# Patient Record
Sex: Female | Born: 2000 | Race: White | Hispanic: No | Marital: Single | Smoking: Former smoker
Health system: Southern US, Community
[De-identification: ages and names within clinical notes are randomized; demographics above are authoritative.]

## PROBLEM LIST (undated history)

## (undated) ENCOUNTER — Inpatient Hospital Stay (HOSPITAL_COMMUNITY): Payer: Self-pay

## (undated) DIAGNOSIS — E669 Obesity, unspecified: Secondary | ICD-10-CM

## (undated) DIAGNOSIS — G4700 Insomnia, unspecified: Secondary | ICD-10-CM

## (undated) DIAGNOSIS — J020 Streptococcal pharyngitis: Secondary | ICD-10-CM

## (undated) DIAGNOSIS — F938 Other childhood emotional disorders: Secondary | ICD-10-CM

## (undated) DIAGNOSIS — N390 Urinary tract infection, site not specified: Secondary | ICD-10-CM

## (undated) DIAGNOSIS — K219 Gastro-esophageal reflux disease without esophagitis: Secondary | ICD-10-CM

## (undated) DIAGNOSIS — N159 Renal tubulo-interstitial disease, unspecified: Secondary | ICD-10-CM

## (undated) DIAGNOSIS — N200 Calculus of kidney: Secondary | ICD-10-CM

## (undated) HISTORY — PX: ADENOIDECTOMY: SUR15

## (undated) HISTORY — PX: TONSILLECTOMY: SUR1361

---
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
## 2019-01-01 ENCOUNTER — Telehealth: Payer: Self-pay | Admitting: Licensed Clinical Social Worker

## 2019-01-01 NOTE — Telephone Encounter (Signed)
Called pt for appt reminder unable to leave voicemail

## 2019-01-02 ENCOUNTER — Other Ambulatory Visit: Payer: Managed Care, Other (non HMO)

## 2019-01-02 ENCOUNTER — Encounter: Payer: Managed Care, Other (non HMO) | Admitting: Obstetrics and Gynecology

## 2019-01-09 ENCOUNTER — Other Ambulatory Visit: Payer: Managed Care, Other (non HMO)

## 2019-01-09 ENCOUNTER — Other Ambulatory Visit: Payer: Self-pay

## 2019-01-09 ENCOUNTER — Ambulatory Visit (INDEPENDENT_AMBULATORY_CARE_PROVIDER_SITE_OTHER): Payer: Managed Care, Other (non HMO) | Admitting: Nurse Practitioner

## 2019-01-09 VITALS — BP 105/66 | HR 86 | Temp 98.8°F | Wt 194.2 lb

## 2019-01-09 DIAGNOSIS — O99343 Other mental disorders complicating pregnancy, third trimester: Secondary | ICD-10-CM

## 2019-01-09 DIAGNOSIS — Z23 Encounter for immunization: Secondary | ICD-10-CM | POA: Diagnosis not present

## 2019-01-09 DIAGNOSIS — Z3400 Encounter for supervision of normal first pregnancy, unspecified trimester: Secondary | ICD-10-CM

## 2019-01-09 DIAGNOSIS — F938 Other childhood emotional disorders: Secondary | ICD-10-CM

## 2019-01-09 DIAGNOSIS — Z3A28 28 weeks gestation of pregnancy: Secondary | ICD-10-CM

## 2019-01-09 NOTE — Patient Instructions (Signed)
https://www.cdc.gov/vaccines/hcp/vis/vis-statements/tdap.pdf">  Tdap Vaccine (Tetanus, Diphtheria and Pertussis): What You Need to Know 1. Why get vaccinated? Tetanus, diphtheria and pertussis are very serious diseases. Tdap vaccine can protect us from these diseases. And, Tdap vaccine given to pregnant women can protect newborn babies against pertussis.. TETANUS (Lockjaw) is rare in the [place] today. It causes painful muscle tightening and stiffness, usually all over the body.  It can lead to tightening of muscles in the head and neck so you can't open your mouth, swallow, or sometimes even breathe. Tetanus kills about 1 out of 10 people who are infected even after receiving the best medical care. DIPHTHERIA is also rare in the [place] today. It can cause a thick coating to form in the back of the throat.  It can lead to breathing problems, heart failure, paralysis, and death. PERTUSSIS (Whooping Cough) causes severe coughing spells, which can cause difficulty breathing, vomiting and disturbed sleep.  It can also lead to weight loss, incontinence, and rib fractures. Up to 2 in 100 adolescents and 5 in 100 adults with pertussis are hospitalized or have complications, which could include pneumonia or death. These diseases are caused by bacteria. Diphtheria and pertussis are spread from person to person through secretions from coughing or sneezing. Tetanus enters the body through cuts, scratches, or wounds. Before vaccines, as many as 200,000 cases of diphtheria, 200,000 cases of pertussis, and hundreds of cases of tetanus, were reported in the [place] each year. Since vaccination began, reports of cases for tetanus and diphtheria have dropped by about 99% and for pertussis by about 80%. 2. Tdap vaccine Tdap vaccine can protect adolescents and adults from tetanus, diphtheria, and pertussis. One dose of Tdap is routinely given at age 11 or 12. People who did not get Tdap at that age  should get it as soon as possible. Tdap is especially important for healthcare professionals and anyone having close contact with a baby younger than 12 months. Pregnant women should get a dose of Tdap during every pregnancy, to protect the newborn from pertussis. Infants are most at risk for severe, life-threatening complications from pertussis. Another vaccine, called Td, protects against tetanus and diphtheria, but not pertussis. A Td booster should be given every 10 years. Tdap may be given as one of these boosters if you have never gotten Tdap before. Tdap may also be given after a severe cut or burn to prevent tetanus infection. Your doctor or the person giving you the vaccine can give you more information. Tdap may safely be given at the same time as other vaccines. 3. Some people should not get this vaccine  A person who has ever had a life-threatening allergic reaction after a previous dose of any diphtheria, tetanus or pertussis containing vaccine, OR has a severe allergy to any part of this vaccine, should not get Tdap vaccine. Tell the person giving the vaccine about any severe allergies.  Anyone who had coma or long repeated seizures within 7 days after a childhood dose of DTP or DTaP, or a previous dose of Tdap, should not get Tdap, unless a cause other than the vaccine was found. They can still get Td.  Talk to your doctor if you: ? have seizures or another nervous system problem, ? had severe pain or swelling after any vaccine containing diphtheria, tetanus or pertussis, ? ever had a condition called Guillain-Barr Syndrome (GBS), ? aren't feeling well on the day the shot is scheduled. 4. Risks With any medicine, including vaccines,   there is a chance of side effects. These are usually mild and go away on their own. Serious reactions are also possible but are rare. Most people who get Tdap vaccine do not have any problems with it. Mild problems following Tdap (Did not interfere  with activities)  Pain where the shot was given (about 3 in 4 adolescents or 2 in 3 adults)  Redness or swelling where the shot was given (about 1 person in 5)  Mild fever of at least 100.4F (up to about 1 in 25 adolescents or 1 in 100 adults)  Headache (about 3 or 4 people in 10)  Tiredness (about 1 person in 3 or 4)  Nausea, vomiting, diarrhea, stomach ache (up to 1 in 4 adolescents or 1 in 10 adults)  Chills, sore joints (about 1 person in 10)  Body aches (about 1 person in 3 or 4)  Rash, swollen glands (uncommon) Moderate problems following Tdap (Interfered with activities, but did not require medical attention)  Pain where the shot was given (up to 1 in 5 or 6)  Redness or swelling where the shot was given (up to about 1 in 16 adolescents or 1 in 12 adults)  Fever over 102F (about 1 in 100 adolescents or 1 in 250 adults)  Headache (about 1 in 7 adolescents or 1 in 10 adults)  Nausea, vomiting, diarrhea, stomach ache (up to 1 or 3 people in 100)  Swelling of the entire arm where the shot was given (up to about 1 in 500). Severe problems following Tdap (Unable to perform usual activities; required medical attention)  Swelling, severe pain, bleeding and redness in the arm where the shot was given (rare). Problems that could happen after any vaccine:  People sometimes faint after a medical procedure, including vaccination. Sitting or lying down for about 15 minutes can help prevent fainting, and injuries caused by a fall. Tell your doctor if you feel dizzy, or have vision changes or ringing in the ears.  Some people get severe pain in the shoulder and have difficulty moving the arm where a shot was given. This happens very rarely.  Any medication can cause a severe allergic reaction. Such reactions from a vaccine are very rare, estimated at fewer than 1 in a million doses, and would happen within a few minutes to a few hours after the vaccination. As with any medicine,  there is a very remote chance of a vaccine causing a serious injury or death. The safety of vaccines is always being monitored. For more information, visit: www.cdc.gov/vaccinesafety/ 5. What if there is a serious problem? What should I look for?  Look for anything that concerns you, such as signs of a severe allergic reaction, very high fever, or unusual behavior. Signs of a severe allergic reaction can include hives, swelling of the face and throat, difficulty breathing, a fast heartbeat, dizziness, and weakness. These would usually start a few minutes to a few hours after the vaccination. What should I do?  If you think it is a severe allergic reaction or other emergency that can't wait, call 9-1-1 or get the person to the nearest hospital. Otherwise, call your doctor.  Afterward, the reaction should be reported to the Vaccine Adverse Event Reporting System (VAERS). Your doctor might file this report, or you can do it yourself through the VAERS web site at www.vaers.hhs.gov, or by calling 1-800-822-7967. VAERS does not give medical advice. 6. The National Vaccine Injury Compensation Program The National Vaccine Injury Compensation Program (  VICP) is a federal program that was created to compensate people who may have been injured by certain vaccines. Persons who believe they may have been injured by a vaccine can learn about the program and about filing a claim by calling 1-800-338-2382 or visiting the VICP website at www.hrsa.gov/vaccinecompensation. There is a time limit to file a claim for compensation. 7. How can I learn more?  Ask your doctor. He or she can give you the vaccine package insert or suggest other sources of information.  Call your local or state health department.  Contact the Centers for Disease Control and Prevention (CDC): ? Call 1-800-232-4636 (1-800-CDC-INFO) or ? Visit CDC's website at www.cdc.gov/vaccines Vaccine Information Statement Tdap Vaccine (04/02/2013) This  information is not intended to replace advice given to you by your health care provider. Make sure you discuss any questions you have with your health care provider. Document Released: 07/26/2011 Document Revised: 09/11/2017 Document Reviewed: 09/11/2017 Elsevier Interactive Patient Education  2020 Elsevier Inc.  

## 2019-01-09 NOTE — Progress Notes (Signed)
    Subjective:  Ana Lloyd is a 18 y.o. G1P0000 at [redacted]w[redacted]d being seen today for ongoing prenatal care.  She is currently monitored for the following issues for this low-risk pregnancy and has MDD (major depressive disorder); Anxiety disorder of adolescence; Insomnia; Morning sickness; and Supervision of normal first pregnancy, antepartum on their problem list.  Patient reports nausea.  Contractions: Not present. Vag. Bleeding: None.  Movement: Present. Denies leaking of fluid.   The following portions of the patient's history were reviewed and updated as appropriate: allergies, current medications, past family history, past medical history, past social history, past surgical history and problem list. Problem list updated.  Objective:   Vitals:   01/09/19 1032  BP: 105/66  Pulse: 86  Temp: 98.8 F (37.1 C)  Weight: 194 lb 3.2 oz (88.1 kg)    Fetal Status: Fetal Heart Rate (bpm): 156 Fundal Height: 30 cm Movement: Present     General:  Alert, oriented and cooperative. Patient is in no acute distress.  Skin: Skin is warm and dry. No rash noted.   Cardiovascular: Normal heart rate noted  Respiratory: Normal respiratory effort, no problems with respiration noted  Abdomen: Soft, gravid, appropriate for gestational age. Pain/Pressure: Absent     Pelvic:  Cervical exam deferred        Extremities: Normal range of motion.  Edema: Trace  Mental Status: Normal mood and affect. Normal behavior. Normal judgment and thought content.   Urinalysis:      Assessment and Plan:  Pregnancy: G1P0000 at [redacted]w[redacted]d  1. Supervision of normal first pregnancy, antepartum Declines flu and understands that flu vaccine is medically recommended during pregnancy. Boyfriend had asymptomatic covid.  Client tested negative and did not have any symptoms. Taking Zofran about once a week as needed. Advised to investigate childbirth classes and breastfeeding classes and sign up for virtual classes.  - TDAP  VACCINE - Glucose Tolerance, 2 Hours w/1 Hour - HIV antibody (with reflex) - CBC - RPR  2. Anxiety disorder of adolescence Anxiety is not an issue at this time.  Discussed higher risk of postpartum depression.  Advised there is [facility] assistance through this office and to contact the office at any time in pregnancy or postpartum if assistance is needed.  Preterm labor symptoms and general obstetric precautions including but not limited to vaginal bleeding, contractions, leaking of fluid and fetal movement were reviewed in detail with the patient. Please refer to After Visit Summary for other counseling recommendations.  Return in about 2 weeks (around 01/23/2019) for virtual ROB.  Earlie Server, RN, MSN, NP-BC Nurse Practitioner, [facility] for [employer] [employer] [facility] 01/09/2019 3:14 PM

## 2019-01-10 LAB — CBC
Hematocrit: 35.5 % (ref 34.0–46.6)
Hemoglobin: 11.9 g/dL (ref 11.1–15.9)
MCH: 30.7 pg (ref 26.6–33.0)
MCHC: 33.5 g/dL (ref 31.5–35.7)
MCV: 92 fL (ref 79–97)
Platelets: 315 10*3/uL (ref 150–450)
RBC: 3.88 x10E6/uL (ref 3.77–5.28)
RDW: 11.8 % (ref 11.7–15.4)
WBC: 17.1 10*3/uL — ABNORMAL HIGH (ref 3.4–10.8)

## 2019-01-10 LAB — RPR: RPR Ser Ql: NONREACTIVE

## 2019-01-10 LAB — GLUCOSE TOLERANCE, 2 HOURS W/ 1HR
Glucose, 1 hour: 114 mg/dL (ref 65–179)
Glucose, 2 hour: 107 mg/dL (ref 65–152)
Glucose, Fasting: 89 mg/dL (ref 65–91)

## 2019-01-10 LAB — HIV ANTIBODY (ROUTINE TESTING W REFLEX): HIV Screen 4th Generation wRfx: NONREACTIVE

## 2019-01-21 ENCOUNTER — Ambulatory Visit (HOSPITAL_COMMUNITY): Payer: Managed Care, Other (non HMO) | Admitting: *Deleted

## 2019-01-21 ENCOUNTER — Other Ambulatory Visit (HOSPITAL_COMMUNITY): Payer: Self-pay | Admitting: *Deleted

## 2019-01-21 ENCOUNTER — Encounter (HOSPITAL_COMMUNITY): Payer: Self-pay

## 2019-01-21 ENCOUNTER — Ambulatory Visit (HOSPITAL_COMMUNITY)
Admission: RE | Admit: 2019-01-21 | Discharge: 2019-01-21 | Disposition: A | Discharge status: Home / Self Care | Payer: Managed Care, Other (non HMO) | Source: Ambulatory Visit | Attending: Obstetrics and Gynecology | Admitting: Obstetrics and Gynecology

## 2019-01-21 ENCOUNTER — Other Ambulatory Visit: Payer: Self-pay

## 2019-01-21 VITALS — BP 103/58 | HR 98 | Temp 97.8°F

## 2019-01-21 DIAGNOSIS — O365930 Maternal care for other known or suspected poor fetal growth, third trimester, not applicable or unspecified: Secondary | ICD-10-CM | POA: Diagnosis present

## 2019-01-21 DIAGNOSIS — Z3A28 28 weeks gestation of pregnancy: Secondary | ICD-10-CM

## 2019-01-21 DIAGNOSIS — O210 Mild hyperemesis gravidarum: Secondary | ICD-10-CM | POA: Diagnosis present

## 2019-01-21 DIAGNOSIS — Z362 Encounter for other antenatal screening follow-up: Secondary | ICD-10-CM

## 2019-01-21 DIAGNOSIS — O365920 Maternal care for other known or suspected poor fetal growth, second trimester, not applicable or unspecified: Secondary | ICD-10-CM | POA: Diagnosis not present

## 2019-01-21 DIAGNOSIS — Z3689 Encounter for other specified antenatal screening: Secondary | ICD-10-CM

## 2019-01-23 ENCOUNTER — Telehealth (INDEPENDENT_AMBULATORY_CARE_PROVIDER_SITE_OTHER): Payer: Managed Care, Other (non HMO) | Admitting: Family Medicine

## 2019-01-23 ENCOUNTER — Encounter: Payer: Self-pay | Admitting: Family Medicine

## 2019-01-23 DIAGNOSIS — Z3403 Encounter for supervision of normal first pregnancy, third trimester: Secondary | ICD-10-CM

## 2019-01-23 DIAGNOSIS — Z3A30 30 weeks gestation of pregnancy: Secondary | ICD-10-CM

## 2019-01-23 DIAGNOSIS — Z3400 Encounter for supervision of normal first pregnancy, unspecified trimester: Secondary | ICD-10-CM

## 2019-01-23 NOTE — Progress Notes (Signed)
Virtual Visit via Telephone Note  I connected with Ana Lloyd on 01/23/19 at  3:15 PM EST by telephone and verified that I am speaking with the correct person using two identifiers  [place] visit no complaints per pt

## 2019-01-23 NOTE — Progress Notes (Signed)
Patient ID: Ana Lloyd, female   DOB: Aug 20, 2000, 18 y.o.   MRN: [id number]  I connected with@ on 01/23/19 at  3:15 PM EST by: MyChart and verified that I am speaking with the correct person using two identifiers.  Patient is located at home and provider is located at [facility].     The purpose of this virtual visit is to provide medical care while limiting exposure to the novel coronavirus. I discussed the limitations, risks, security and privacy concerns of performing an evaluation and management service by MyChart and the availability of in person appointments. I also discussed with the patient that there may be a patient responsible charge related to this service. By engaging in this virtual visit, you consent to the provision of healthcare.  Additionally, you authorize for your insurance to be billed for the services provided during this visit.  The patient expressed understanding and agreed to proceed.  The following staff members participated in the virtual visit:  Clarnce Flock, MD    PRENATAL VISIT NOTE  Subjective:  Ana Lloyd is a 18 y.o. G1P0000 at [redacted]w[redacted]d  for phone visit for ongoing prenatal care.  She is currently monitored for the following issues for this low-risk pregnancy and has MDD (major depressive disorder); Anxiety disorder of adolescence; Insomnia; Morning sickness; and Supervision of normal first pregnancy, antepartum on their problem list.  Patient reports no bleeding, no contractions, no cramping and no leaking.  Contractions: Not present. Vag. Bleeding: None.  Movement: Present. Denies leaking of fluid.   The following portions of the patient's history were reviewed and updated as appropriate: allergies, current medications, past family history, past medical history, past social history, past surgical history and problem list.   Objective:   Vitals:   01/23/19 1442  BP: 121/69  Pulse: (!) 108   Self-Obtained  Fetal Status:     Movement:  Present     Assessment and Plan:  Pregnancy: G1P0000 at [redacted]w[redacted]d 1. Supervision of normal first pregnancy, antepartum Stable BP normal Serial growths with MFM for ?IUGR, growth now normal and likely secondary to inaccurate dates, has follow up scheduled for next month, she is aware Prenatal testing up to date Discussed PP contraception, she is interested in post-placental IUD, unclear if covered since she has [place] w medicaid secondary, will clarify and send patient update  Preterm labor symptoms and general obstetric precautions including but not limited to vaginal bleeding, contractions, leaking of fluid and fetal movement were reviewed in detail with the patient.  Return in 2 weeks (on 02/06/2019) for [facility], virtual, ob visit.  Future Appointments  Date Time Provider [place]  02/19/2019  8:30 AM WH-MFC [place] 5 WH-MFCUS MFC-US  02/19/2019  8:35 AM WH-MFC NURSE [place] MFC-US     Time spent on virtual visit: 10 minutes  Clarnce Flock, MD

## 2019-01-23 NOTE — Patient Instructions (Signed)

## 2019-02-06 ENCOUNTER — Telehealth (INDEPENDENT_AMBULATORY_CARE_PROVIDER_SITE_OTHER): Payer: Managed Care, Other (non HMO) | Admitting: Medical

## 2019-02-06 ENCOUNTER — Encounter: Payer: Self-pay | Admitting: Medical

## 2019-02-06 DIAGNOSIS — O210 Mild hyperemesis gravidarum: Secondary | ICD-10-CM

## 2019-02-06 DIAGNOSIS — Z3A32 32 weeks gestation of pregnancy: Secondary | ICD-10-CM

## 2019-02-06 DIAGNOSIS — Z3400 Encounter for supervision of normal first pregnancy, unspecified trimester: Secondary | ICD-10-CM

## 2019-02-06 NOTE — Patient Instructions (Addendum)
Fetal Movement Counts Patient Name: ________________________________________________ Patient Due Date: ____________________ What is a fetal movement count?  A fetal movement count is the number of times that you feel your baby move during a certain amount of time. This may also be called a fetal kick count. A fetal movement count is recommended for every pregnant woman. You may be asked to start counting fetal movements as early as week 28 of your pregnancy. Pay attention to when your baby is most active. You may notice your baby's sleep and wake cycles. You may also notice things that make your baby move more. You should do a fetal movement count:  When your baby is normally most active.  At the same time each day. A good time to count movements is while you are resting, after having something to eat and drink. How do I count fetal movements? 1. Find a quiet, comfortable area. Sit, or lie down on your side. 2. Write down the date, the start time and stop time, and the number of movements that you felt between those two times. Take this information with you to your health care visits. 3. For 2 hours, count kicks, flutters, swishes, rolls, and jabs. You should feel at least 10 movements during 2 hours. 4. You may stop counting after you have felt 10 movements. 5. If you do not feel 10 movements in 2 hours, have something to eat and drink. Then, keep resting and counting for 1 hour. If you feel at least 4 movements during that hour, you may stop counting. Contact a health care provider if:  You feel fewer than 4 movements in 2 hours.  Your baby is not moving like he or she usually does. Date: ____________ Start time: ____________ Stop time: ____________ Movements: ____________ Date: ____________ Start time: ____________ Stop time: ____________ Movements: ____________ Date: ____________ Start time: ____________ Stop time: ____________ Movements: ____________ Date: ____________ Start time:  ____________ Stop time: ____________ Movements: ____________ Date: ____________ Start time: ____________ Stop time: ____________ Movements: ____________ Date: ____________ Start time: ____________ Stop time: ____________ Movements: ____________ Date: ____________ Start time: ____________ Stop time: ____________ Movements: ____________ Date: ____________ Start time: ____________ Stop time: ____________ Movements: ____________ Date: ____________ Start time: ____________ Stop time: ____________ Movements: ____________ This information is not intended to replace advice given to you by your health care provider. Make sure you discuss any questions you have with your health care provider. Document Released: 02/23/2006 Document Revised: 02/13/2018 Document Reviewed: 03/05/2015 Elsevier Patient Education  2020 Elsevier Inc. [employer] of the uterus can occur throughout pregnancy, but they are not always a sign that you are in labor. You may have practice contractions called Braxton Hicks contractions. These false labor contractions are sometimes confused with true labor. What are Montine Circle contractions? Braxton Hicks contractions are tightening movements that occur in the muscles of the uterus before labor. Unlike true labor contractions, these contractions do not result in opening (dilation) and thinning of the cervix. Toward the end of pregnancy (32-34 weeks), Braxton Hicks contractions can happen more often and may become stronger. These contractions are sometimes difficult to tell apart from true labor because they can be very uncomfortable. You should not feel embarrassed if you go to the hospital with false labor. Sometimes, the only way to tell if you are in true labor is for your health care provider to look for changes in the cervix. The health care provider will do a physical exam and may monitor your contractions. If you  are not in true labor, the exam should show  that your cervix is not dilating and your water has not broken. If there are no other health problems associated with your pregnancy, it is completely safe for you to be sent home with false labor. You may continue to have Braxton Hicks contractions until you go into true labor. How to tell the difference between true labor and false labor True labor  Contractions last 30-70 seconds.  Contractions become very regular.  Discomfort is usually felt in the top of the uterus, and it spreads to the lower abdomen and low back.  Contractions do not go away with walking.  Contractions usually become more intense and increase in frequency.  The cervix dilates and gets thinner. False labor  Contractions are usually shorter and not as strong as true labor contractions.  Contractions are usually irregular.  Contractions are often felt in the front of the lower abdomen and in the groin.  Contractions may go away when you walk around or change positions while lying down.  Contractions get weaker and are shorter-lasting as time goes on.  The cervix usually does not dilate or become thin. Follow these instructions at home:   Take over-the-counter and prescription medicines only as told by your health care provider.  Keep up with your usual exercises and follow other instructions from your health care provider.  Eat and drink lightly if you think you are going into labor.  If Braxton Hicks contractions are making you uncomfortable: ? Change your position from lying down or resting to walking, or change from walking to resting. ? Sit and rest in a tub of warm water. ? Drink enough fluid to keep your urine pale yellow. Dehydration may cause these contractions. ? Do slow and deep breathing several times an hour.  Keep all follow-up prenatal visits as told by your health care provider. This is important. Contact a health care provider if:  You have a fever.  You have continuous pain in  your abdomen. Get help right away if:  Your contractions become stronger, more regular, and closer together.  You have fluid leaking or gushing from your vagina.  You pass blood-tinged mucus (bloody show).  You have bleeding from your vagina.  You have low back pain that you never had before.  You feel your baby's head pushing down and causing pelvic pressure.  Your baby is not moving inside you as much as it used to. Summary  Contractions that occur before labor are called Braxton Hicks contractions, false labor, or practice contractions.  Braxton Hicks contractions are usually shorter, weaker, farther apart, and less regular than true labor contractions. True labor contractions usually become progressively stronger and regular, and they become more frequent.  Manage discomfort from [facility] contractions by changing position, resting in a warm bath, drinking plenty of water, or practicing deep breathing. This information is not intended to replace advice given to you by your health care provider. Make sure you discuss any questions you have with your health care provider. Document Released: 06/09/2016 Document Revised: 01/06/2017 Document Reviewed: 06/09/2016 Elsevier Patient Education  2020 [place] [phone]) . [facility] Davy Pique, MD; Gwendlyn Deutscher, MD; Walker Kehr, MD; Andria Frames, MD; McDiarmid, MD; Dutch Quint, MD; Nori Riis, MD; [place], [address] [place], [place] [address] o [phone] o Mon-Fri 8:30-12:30, 1:30-5:00 o Providers come to see babies at [facility] o Accepting Medicaid . [facility] providers who accept newborns: Dorthy Cooler, MD; Orland Mustard, MD; Stephanie Acre, MD o [place], [place], [place] [address] o [phone] o Mon-Fri 8:00-5:30 o Babies seen by providers at [facility] o Does NOT accept  Medicaid o Please call early in hospitalization for appointment (limited availability)  . [place], MD o [address] [place], [place] [address] o [phone] o Mon, Tue, Thur, Fri 8:30-5:00, Wed 10:00-7:00 (closed 1-2pm) o Babies seen by [facility] providers o Accepting Medicaid . [place], MD o [place], [place], [place] [address] o [phone] o Mon-Fri 8:30-5:00, Sat 8:30-12:00 o Provider comes to see babies at [place] Medicaid o Must have been referred from current patients or contacted office prior to delivery . [facility] ([facility] [place]) Franne Forts, MD; Tamera Punt, MD; Doneen Poisson, MD; Fatima Sanger, MD; Wynetta Emery, MD; Jess Barters, MD; Tami Ribas, MD; Herbert Moors, MD; Derrell Lolling, MD; Dorothyann Peng, MD; Lucious Groves, NP; Baldo Ash, NP o [address] [place] [address], [place], [place] [address] o [phone] o Mon, Tue, Thur, Fri 8:30-5:30, Wed 9:30-5:30, Sat 8:30-12:30 o Babies seen by [facility] providers o Accepting Medicaid o Only accepting infants of first-time parents or siblings of current patients [facility] discharge coordinator will make follow-up appointment . Baltazar Najjar o [place] [address], [place], [place]  [address] o [phone]   Fax - [phone] . [facility] o [address], [place], [place]  [address] o Phone - [phone]   Fax [phone] . [place], [place], [place], [place]  [address] o [phone]  [place] [phone]) . [facility] Reginal Lutes, MD; Jacklynn Ganong, MD; Torrie Mayers, MD; MD; Rosana Hoes, MD; Servando Salina, MD; Rose Fillers, MD; Rex Kras, MD; Corinna Capra, MD; Volney American, MD; Trilby Drummer, MD; Janann Colonel, MD; Jimmye Norman, [place], [place], [place] [address] o [phone] o Mon-Fri 8:30-5:00 (extended evenings Mon-Thur as needed), Sat-Sun 10:00-1:00 o Providers come to see babies at [place] Medicaid for families of first-time babies and families with all children in the household age 24 and under. Must register with office prior to making appointment (M-F only). . [place], NP; Tomi Bamberger, MD; [place] School, MD; [place], [address] [place], [place] [address] o [phone] o Mon-Fri 8:00-5:00 o Babies seen by providers at [facility] o Does NOT accept Medicaid/Commercial Insurance Only . [facility] at [place] ([facility])  Marnee Guarneri, MD; Drema Dallas, MD; Montine Circle, MD; Vilma Prader, MD; Vanita Panda, MD; Alfonso Ramus, MD; Ruthann Cancer, MD; Roxanne Mins, MD; Rosalva Ferron, MD; Polly Cobia, MD o [address] [place], [place] [address] o [phone] o Mon-Fri 8:30-5:30, Sat (Oct.-Mar.) 9:00-1:00 o Babies seen by providers at [place] [phone]) . [facility] Peers, MD; Suzan Slick, MD o [address], [place], [place] [address] o [phone] o Mon-Fri 8:30-5:00, Sat 8:30-12:00 o Providers come to see babies at [facility] o Does NOT accept Medicaid . [place] at [place], [place]; [place], MD; [place], [place]; Nancy Fetter, MD; Moreen Fowler, [place], [place],  [address] o [phone] o Mon-Fri 8:00-5:00 o Babies seen by providers at [facility] o Does NOT accept Medicaid o Only accepting babies of parents who are patients o Please call early in hospitalization for appointment (limited availability) . [facility] Blanca Friend, MD; Sharlene Motts, MD; Rod Can, MD; Warner Mccreedy, NP; Sabra Heck, MD; Ermalinda Memos, MD; Sharlett Iles, NP; Aurther Loft, MD; Jerrye Beavers, MD; Marcello Moores, MD; Berline Lopes, MD; Charolette Forward, MD o [place]. [place], [place], [place] [address] o [phone] o Mon-Fri 8:00-5:00, Sat 9:00-12:00 o Providers come to see babies at [facility] o Does NOT accept [facility] [phone]) . [place] [facility] [place] providers accepting new patients: Dayna Ramus, NP; [place], [place], [place], [place] [address] o [phone] o Mon-Fri 8:00-5:00 o Babies seen by providers at [facility] o Does NOT accept Medicaid o Only accepting babies of parents who are patients o Please call early in hospitalization for appointment (limited availability) . [facility] Oswaldo Conroy, MD; Sheran Lawless, MD o [place] [place], [place] [address] o [phone] (press 1 to schedule appointment) o Mon-Fri 8:00-5:00 o Providers come to [facility] at [facility] o Does NOT accept Medicaid . [facility] Jodi Mourning, MD o [address] [place], [place] [address] o [phone] o Mon-Fri 8:30-5:00 (lunch 12:30-1:00), extended hours by appointment only Wed 5:00-6:30 o Babies seen by [facility] providers o Accepting Medicaid . [place] at Evalyn Casco, MD; [place], MD; Ethlyn Gallery, MD o [place], [place], [place] [address] o [phone] o Mon-Fri 8:00-5:00 o Babies seen by [facility] providers o Does NOT accept Medicaid . Therapist, music at [place], MD; Yong Channel, MD; [place], [place] [address] [place], [place] [address] o [phone] o Mon-Fri 8:00-5:00 o Babies seen by [facility] providers o Does NOT accept Medicaid . [place], [place]; [place], [place]; [place], NP; Albertina Parr, MD; Frederic Jericho, MD; Ronney Lion, MD; Carlos Levering, NP; Jerelene Redden, NP; Tomasita Crumble, NP; Ronelle Nigh, NP; Corinna Lines, MD; [place], MD o [address] [place]., [place], [place] [address] o [phone] o Mon-Fri 8:30-5:00, Sat 10:00-1:00 o Providers come to see babies at [facility] o Does NOT accept Medicaid o Free prenatal information session Tuesdays at 4:45pm . [facility] Porfirio Oar, MD; [place], [place]; [place], [place]; [place], [place]., [place] [address] o [phone] o Mon-Fri  7:30-5:30 o Babies seen by [facility] providers . [facility] o [address] [place], [place], [place]  [address] o [phone]   Fax - [phone]  [place] [phone] & [phone]) . [place] o [address] [place], [place] [address] o [phone] o Mon-Thur 8:00-6:00 o Providers come to see babies at [place] Medicaid . [place], NP; Melford Aase, MD; [place], [place]; [place], [address]., [place], [place] [address] o [phone] o Mon-Thur 7:30-7:30, Fri 7:30-4:30 o Babies seen by [facility] providers o Accepting Medicaid . [facility] Nyra Jabs, MD; Cristino Martes, NP; Gertie Baron, MD o [address], [place], [place] [address] o [phone] o Mon-Fri 8:30-5:00, Sat 8:30-12:00 o Providers come to see babies at [place] Medicaid o Must have "Meet & Greet" appointment at office prior to delivery . [place] ([place]) Jodene Nam, MD; Juleen China, MD; Clydene Laming, [place] [address], [place], [place] [address] o [phone] o Mon-Wed 8:00-6:00, Thur-Fri 8:00-5:00, Sat 9:00-12:00 o Providers come to see babies at [facility] o Does NOT accept Medicaid o Only accepting siblings of current patients . [facility] [place], [place], [place],  [place]  [address] o [phone]   Fax - [phone] . [place] at [place] [address] [place], [place]  [address] o [phone]   Fax - [place] [phone] & [phone]) . Therapist, music at [place], DO; [place], [place]., [place], [place] [address] o [phone] o Mon-Fri 7:00-5:00 o Babies seen by [facility] providers o Does NOT accept Medicaid . [place], MD; [place], [place]; [place], [place], [place], [place] [address] o [phone] o Mon-Fri 8:00-5:00 o Babies seen by [facility] providers o Accepting Medicaid . [place], MD; [place], [place]; [place], NP; [place], [place], [place], [place] [address] o [phone] o Mon-Fri 8:00-5:00 o Babies seen by providers at [place] [phone]) . [facility] [place], [place] o [address]., [place], [place] [address] o [phone] o Mon-Fri 8:00-5:00 o Babies seen by [facility] providers o Does NOT accept Medicaid o Limited availability, please call early in hospitalization to schedule follow-up . [facility] Leilani Merl, PA; Maisie Fus, MD; Gas, MD; [place], [place]; Jeannine Kitten, MD; [place], [place] [facility] [address], [place], [place] [address] o [phone] o Mon-Fri 8:30-5:00, Sat 9:00-12:00 o Babies seen by providers at [facility] o Accepting Medicaid o Please register online then schedule online or call office o [email or web address] . [place] ([facility] Perez at [place]) Kristian Covey, NP; Dwyane Dee, MD; Leonidas Romberg, PA o [address] [place], [place], [place] [address] o [phone] o Mon-Fri 8:00-5:00 o Babies seen by providers at [facility] o Accepting Medicaid . [place] ([facility] [employer]) Dairl Ponder, MD; Rayvon Char, NP; Melina Modena, MD o [address] [place], [place], [place] [address] o [phone] o Mon-Fri 8:00-5:30, Sat&Sun by appointment (phones open at 8:30) o Babies seen by [facility] providers o Accepting Medicaid o Must be a first-time baby or sibling of current patient . [place], [address], [place], [place]   [address] o [phone]   Fax - [phone]  [place] [phone] & [phone]) . [place], [place]; [place], [place]; Benjamine Mola, MD; [place], [place]; Harrell Lark, MD o [address], [place], [place] [address] o [phone] o Mon-Thur 8:00-7:00, Fri 8:00-5:00, Sat 8:00-12:00, Sun 9:00-12:00 o Babies seen by [facility] providers o Accepting Medicaid . [facility] at [facility], MD; Ruthann Cancer, MD; [facility], MD o [address], [place],  [address] o [phone] o Mon-Thur 8:00-5:00 o Babies seen by providers at [facility] o Accepting Medicaid . [facility] at [place], MD; Coe[place]Go[place], MD; Amedeo Plenty, MD; Bobby Rumpf, MD; List, MD; [place] Drafts, MD; Ruthann Cancer, MD; Selinda Eon, MD; Audie Box, MD; Jim Like, MD; Christie Nottingham, MD; Hubbard Hartshorn, MD; Modena Nunnery, MD o [address] [place], [place] [address] o [phone] o Mon-Fri 8:00-5:30, Sat (Oct.-Mar.) 9:00-1:00 o Babies seen by providers at [facility] o Accepting Medicaid o Must fill out new patient packet, available online at [email or web address] . [place] ([facility]) Barnabas Lister, NP; Kenton Kingfisher, NP; Claiborne Billings, NP; Rolla Plate, MD; [place], [place]; Carola Rhine, MD; Tyron Russell, MD; Delia Chimes,  NP o [address], [place], [place] [address] o [phone] o Mon-Thur 8:00-5:30, Fri 8:00-5:00 o Babies seen by providers at [place] [phone]) . [place], [place]; [place], MD; Dennard Schaumann, MD; [place], [place] o [address] [place], [place] [address] o [phone] o Mon-Fri 8:00-5:00 o Babies seen by providers at [place] [phone]) . [place] at [place], [place]; Olen Pel, MD; [place], [place], [place], [place] [address] o [phone] o Mon-Fri 8:00-5:00 o Babies  seen by providers at [facility] o Does NOT accept Medicaid o Limited appointment availability, please call early in hospitalization  . Therapist, music at [place], [place]; [place], [place] [address] [place], [place] [address] o [phone] o Mon-Fri 8:00-5:00 o Babies seen by [facility] providers o Does NOT accept Medicaid . [facility] Su Grand, MD; Guy Sandifer, MD; [place], [place]; [place], [place] [address], [place], [place] [address] o [phone] o Mon-Fri 8:00-5:00 o After hours clinic [facility] [place], [place] [address]) [phone] Mon-Fri 5:00-8:00, Sat 12:00-6:00, Sun 10:00-4:00 o Babies seen by [facility] providers o Accepting Medicaid . [place] at [facility] o [address], [place], [place]  [address] o [phone]   Fax - [phone]  [place] [phone]) . Therapist, music at [facility] [place], MD o [address] [place], [place], [place] [address] o [phone] o Mon-Fri 8:00-5:00 o Babies seen by [facility] providers o Does NOT accept Medicaid . [place] ([place] [facility] [place]) Bing Neighbors, MD o [address] [place], [place],  [address] o [phone] o Mon-Thur 8:00-7:00, Fri 8:00-5:00, Sat 8:00-12:00 o Babies seen by providers at [facility] o Accepting Medicaid - but does not have vaccinations in office (must be received elsewhere) o Limited availability, please call early in hospitalization  [place] ([address]) . [place], MD o [address], [place] [address] o [phone]  Fax [phone]  Childbirth Education Options: [facility] Department Classes:  Childbirth education classes can help you get ready for a positive parenting experience. You can also meet other expectant parents and get free stuff for your baby. Each class runs for  five weeks on the same night and costs $45 for the mother-to-be and her support person. Medicaid covers the cost if you are eligible. Call [phone] to register. [facility] Childbirth Education:  [phone] or [phone] or [email or web address]  Baby & Me Class: Discuss newborn & infant parenting and family adjustment issues with other new mothers in a relaxed environment. Each week brings a new speaker or baby-centered activity. We encourage new mothers to join [place] every Thursday at 11:00am. Babies birth until crawling. No registration or fee. Daddy [employer]: This course offers Dads-to-be the tools and knowledge needed to feel confident on their journey to becoming new fathers. Experienced dads, who have been trained as coaches, teach dads-to-be how to hold, comfort, diaper, swaddle and play with their infant while being able to support the new mom as well. A class for men taught by men. $25/dad Big Brother/Big Sister: Let your children share in the joy of a new brother or sister in this special class designed just for them. Class includes discussion about how families care for  babies: swaddling, holding, diapering, safety as well as how they can be helpful in their new role. This class is designed for children ages 26 to 67, but any age is welcome. Please register each child individually. $5/child  Mom Talk: This mom-led group offers support and connection to mothers as they journey through the adjustments and struggles of that sometimes overwhelming first year after the birth of a child. Tuesdays at 10:00am and Thursdays at 6:00pm. Babies welcome. No registration or fee. Breastfeeding Support Group: This group is a mother-to-mother support circle where moms have the opportunity to share their breastfeeding experiences. A Lactation Consultant is present for questions and concerns. Meets each Tuesday at 11:00am. No fee or registration. Breastfeeding Your Baby: Learn what to expect in the  first days of breastfeeding your newborn.  This class will help you feel more confident with the skills needed to begin your breastfeeding experience. Many new mothers are concerned about breastfeeding after leaving the hospital. This class will also address the most common fears and challenges about breastfeeding during the first few weeks, months and beyond. (call for fee) Comfort Techniques and Tour: This 2 hour interactive class will provide you the opportunity to learn & practice hands-on techniques that can help relieve some of the discomfort of labor and encourage your baby to rotate toward the best position for birth. You and your partner will be able to try a variety of labor positions with birth balls and rebozos as well as practice breathing, relaxation, and visualization techniques. A tour of the [facility] is included with this class. $20 per registrant and support person Childbirth Class- Weekend Option: This class is a Weekend version of our Birth & Baby series. It is designed for parents who have a difficult time fitting several weeks of classes into their schedule. It covers the care of your newborn and the basics of labor and childbirth. It also includes a [facility] and lunch. The class is held two consecutive days: beginning on Friday evening from 6:30 - 8:30 p.m. and the next day, Saturday from 9 a.m. - 4 p.m. (call for fee) Doren Custard Class: Interested in a waterbirth?  This informational class will help you discover whether waterbirth is the right fit for you. Education about waterbirth itself, supplies you would need and how to assemble your support team is what you can expect from this class. Some obstetrical practices require this class in order to pursue a waterbirth. (Not all obstetrical practices offer waterbirth-check with your healthcare provider.) Register only the expectant mom, but you are encouraged to bring your  partner to class! Required if planning waterbirth, no fee. Infant/Child CPR: Parents, grandparents, babysitters, and friends learn Cardio-Pulmonary Resuscitation skills for infants and children. You will also learn how to treat both conscious and unconscious choking in infants and children. This Family & Friends program does not offer certification. Register each participant individually to ensure that enough mannequins are available. (Call for fee) Grandparent Love: Expecting a grandbaby? This class is for you! Learn about the latest infant care and safety recommendations and ways to support your own child as he or she transitions into the parenting role. Taught by Registered Nurses who are childbirth instructors, but most importantly...they are grandmothers too! $10/person. Childbirth Class- Natural Childbirth: This series of 5 weekly classes is for expectant parents who want to learn and practice natural methods of coping with the process of labor and childbirth. Relaxation, breathing, massage, visualization, role  of the partner, and helpful positioning are highlighted. Participants learn how to be confident in their body's ability to give birth. This class will empower and help parents make informed decisions about their own care. Includes discussion that will help new parents transition into the immediate postpartum period. [facility] is included. We suggest taking this class between 25-32 weeks, but it's only a recommendation. $75 per registrant and one support person or $30 Medicaid. Childbirth Class- 3 week Series: This option of 3 weekly classes helps you and your labor partner prepare for childbirth. Newborn care, labor & birth, cesarean birth, pain management, and comfort techniques are discussed and a [place] [facility] is included. The class meets at the same time, on the same day of the week for 3 consecutive weeks beginning with the  starting date you choose. $60 for registrant and one support person.  Marvelous Multiples: Expecting twins, triplets, or more? This class covers the differences in labor, birth, parenting, and breastfeeding issues that face multiples' parents. NICU tour is included. Led by a Certified Childbirth Educator who is the mother of twins. No fee. Caring for Baby: This class is for expectant and adoptive parents who want to learn and practice the most up-to-date newborn care for their babies. Focus is on birth through the first six weeks of life. Topics include feeding, bathing, diapering, crying, umbilical cord care, circumcision care and safe sleep. Parents learn to recognize symptoms of illness and when to call the pediatrician. Register only the mom-to-be and your partner or support person can plan to come with you! $10 per registrant and support person Childbirth Class- online option: This online class offers you the freedom to complete a Birth and Baby series in the comfort of your own home. The flexibility of this option allows you to review sections at your own pace, at times convenient to you and your support people. It includes additional video information, animations, quizzes, and extended activities. Get organized with helpful eClass tools, checklists, and trackers. Once you register online for the class, you will receive an email within a few days to accept the invitation and begin the class when the time is right for you. The content will be available to you for 60 days. $60 for 60 days of online access for you and your support people.  Local Doulas: Natural Baby Doulas [email or web address] Tel: [phone] [email or web address] [employer] [phone] [email or web address] The Labor Hassell Halim  (also do waterbirth tub rental) [phone] [email or web address] [facility] Birth  Doula [phone] [email or web address] NotebookDistributors.fi [facility] Rhythms  [phone] [email or web address] [employer] (PADA) [email or web address] [place] Birth and Baby  [email or web address] Considering Waterbirth? Guide for patients at [facility] [employer]  Why consider waterbirth?  . Gentle birth for babies . Less pain medicine used in labor . May allow for passive descent/less pushing . May reduce perineal tears  . More mobility and instinctive maternal position changes . Increased maternal relaxation . Reduced blood pressure in labor  Is waterbirth safe? What are the risks of infection, drowning or other complications?  . Infection: o Very low risk (3.7 % for tub vs 4.8% for bed) o 7 in 8000 waterbirths with documented infection o Poorly cleaned equipment most common cause o Slightly lower group B strep transmission rate  . Drowning o Maternal:  - Very low risk   - Related to seizures or fainting o Newborn:  - Very low risk. No evidence of increased risk of  respiratory problems in multiple large studies - Physiological protection from breathing under water - Avoid underwater birth if there are any fetal complications - Once baby's head is out of the water, keep it out.  . Birth complication o Some reports of cord trauma, but risk decreased by bringing baby to surface gradually o No evidence of increased risk of shoulder dystocia. Mothers can usually change positions faster in water than in a bed, possibly aiding the maneuvers to free the shoulder.   You must attend a Doren Custard class at [facility]  3rd Wednesday of every month from 7-9pm  [employer] by calling [phone] or online at [email or web address]  Bring [place] the certificate from the class to your prenatal appointment  Meet with a midwife at 36 weeks to see if you can still plan a  waterbirth and to sign the consent.   Purchase or rent the following supplies:   Water Birth Pool (Birth Pool in a Box or [place] for instance)  (Tubs start ~$125)  Single-use disposable tub liner designed for your brand of tub  New garden hose labeled "lead-free", "suitable for drinking water",  Electric drain pump to remove water (We recommend 792 gallon per hour or greater pump.)   Separate garden hose to remove the dirty water  Fish net  Bathing suit top (optional)  Long-handled mirror (optional)  Places to purchase or rent supplies  GotWebTools.is for tub purchases and supplies  Waterbirthsolutions.com for tub purchases and supplies  The Labor Ladies (www.[email or web address]) $275 for tub rental/set-up & take down/kit   [employer] (http://www.fleming.com/.htm) Information regarding doulas (labor support) who provide pool rentals  Our practice has a Birth Pool in a Box tub at the hospital that you may borrow on a first-come-first-served basis. It is your responsibility to to set up, clean and break down the tub. We cannot guarantee the availability of this tub in advance. You are responsible for bringing all accessories listed above. If you do not have all necessary supplies you cannot have a waterbirth.    Things that would prevent you from having a waterbirth:  Premature, <37wks  Previous cesarean birth  Presence of thick meconium-stained fluid  Multiple gestation (Twins, triplets, etc.)  Uncontrolled diabetes or gestational diabetes requiring medication  Hypertension requiring medication or diagnosis of pre-eclampsia  Heavy vaginal bleeding  Non-reassuring fetal heart rate  Active infection (MRSA, etc.). Group B Strep is NOT a contraindication for  waterbirth.  If your labor has to be induced and induction method requires continuous  monitoring of the baby's heart rate  Other risks/issues identified by your obstetrical  provider  Please remember that birth is unpredictable. Under certain unforeseeable circumstances your provider may advise against giving birth in the tub. These decisions will be made on a case-by-case basis and with the safety of you and your baby as our highest priority.

## 2019-02-06 NOTE — Progress Notes (Signed)
I connected with  Ana Lloyd on 02/06/19 by a video enabled telemedicine application and verified that I am speaking with the correct person using two identifiers.   I discussed the limitations of evaluation and management by telemedicine. The patient expressed understanding and agreed to proceed.   MyChart ROB.  Reports no problems today.

## 2019-02-06 NOTE — Progress Notes (Signed)
I connected with [place] on 02/06/19 at 10:55 AM EST by: MyChart and verified that I am speaking with the correct person using two identifiers.  Patient is located at home and provider is located at [facility].     The purpose of this virtual visit is to provide medical care while limiting exposure to the novel coronavirus. I discussed the limitations, risks, security and privacy concerns of performing an evaluation and management service by MyChart and the availability of in person appointments. I also discussed with the patient that there may be a patient responsible charge related to this service. By engaging in this virtual visit, you consent to the provision of healthcare.  Additionally, you authorize for your insurance to be billed for the services provided during this visit.  The patient expressed understanding and agreed to proceed.  The following staff members participated in the virtual visit:  Jasmine Awe, RMA    PRENATAL VISIT NOTE  Subjective:  Ana Lloyd is a 18 y.o. G1P0000 at [redacted]w[redacted]d  for phone visit for ongoing prenatal care.  She is currently monitored for the following issues for this low-risk pregnancy and has MDD (major depressive disorder); Anxiety disorder of adolescence; Insomnia; Morning sickness; and Supervision of normal first pregnancy, antepartum on their problem list.  Patient reports no complaints.  Contractions: Not present. Vag. Bleeding: None.  Movement: Present. Denies leaking of fluid.   The following portions of the patient's history were reviewed and updated as appropriate: allergies, current medications, past family history, past medical history, past social history, past surgical history and problem list.   Objective:   Vitals:   02/06/19 1057  BP: 116/74  Pulse: (!) 117   Self-Obtained  Fetal Status:     Movement: Present     Assessment and Plan:  Pregnancy: G1P0000 at [redacted]w[redacted]d 1. Morning sickness - Improved   2. Supervision  of normal first pregnancy, antepartum - Doing well, no complaints  - Peds list given   Preterm labor symptoms and general obstetric precautions including but not limited to vaginal bleeding, contractions, leaking of fluid and fetal movement were reviewed in detail with the patient.  Return in about 2 weeks (around 02/20/2019) for LOB, Virtual.  Future Appointments  Date Time Provider [place]  02/19/2019  8:30 AM [facility]-MFC [place] 5 WH-MFCUS MFC-US  02/19/2019  8:35 AM [place] MFC-US     Time spent on virtual visit: 10 minutes  Kerry Hough, PA-C

## 2019-02-19 ENCOUNTER — Ambulatory Visit (HOSPITAL_COMMUNITY): Payer: Managed Care, Other (non HMO) | Admitting: *Deleted

## 2019-02-19 ENCOUNTER — Ambulatory Visit (HOSPITAL_COMMUNITY)
Admission: RE | Admit: 2019-02-19 | Discharge: 2019-02-19 | Disposition: A | Discharge status: Home / Self Care | Payer: Managed Care, Other (non HMO) | Source: Ambulatory Visit | Attending: Obstetrics and Gynecology | Admitting: Obstetrics and Gynecology

## 2019-02-19 ENCOUNTER — Other Ambulatory Visit: Payer: Self-pay

## 2019-02-19 ENCOUNTER — Encounter (HOSPITAL_COMMUNITY): Payer: Self-pay

## 2019-02-19 VITALS — BP 117/71 | HR 97 | Temp 97.5°F

## 2019-02-19 DIAGNOSIS — Z3A33 33 weeks gestation of pregnancy: Secondary | ICD-10-CM | POA: Diagnosis not present

## 2019-02-19 DIAGNOSIS — O0990 Supervision of high risk pregnancy, unspecified, unspecified trimester: Secondary | ICD-10-CM | POA: Diagnosis present

## 2019-02-19 DIAGNOSIS — Z362 Encounter for other antenatal screening follow-up: Secondary | ICD-10-CM

## 2019-02-19 DIAGNOSIS — O365930 Maternal care for other known or suspected poor fetal growth, third trimester, not applicable or unspecified: Secondary | ICD-10-CM | POA: Diagnosis not present

## 2019-02-19 DIAGNOSIS — Z3689 Encounter for other specified antenatal screening: Secondary | ICD-10-CM | POA: Diagnosis present

## 2019-02-20 ENCOUNTER — Encounter: Payer: Self-pay | Admitting: Obstetrics

## 2019-02-20 ENCOUNTER — Telehealth (INDEPENDENT_AMBULATORY_CARE_PROVIDER_SITE_OTHER): Payer: Managed Care, Other (non HMO) | Admitting: Obstetrics

## 2019-02-20 DIAGNOSIS — Z3400 Encounter for supervision of normal first pregnancy, unspecified trimester: Secondary | ICD-10-CM

## 2019-02-20 DIAGNOSIS — F938 Other childhood emotional disorders: Secondary | ICD-10-CM

## 2019-02-20 DIAGNOSIS — Z3A34 34 weeks gestation of pregnancy: Secondary | ICD-10-CM

## 2019-02-20 DIAGNOSIS — O26893 Other specified pregnancy related conditions, third trimester: Secondary | ICD-10-CM

## 2019-02-20 NOTE — Progress Notes (Signed)
   TELEHEALTH VIRTUAL OBSTETRICS VISIT ENCOUNTER NOTE  I connected with Ana Lloyd on 02/20/19 at 11:00 AM EST by telephone at home and verified that I am speaking with the correct person using two identifiers.   I discussed the limitations, risks, security and privacy concerns of performing an evaluation and management service by telephone and the availability of in person appointments. I also discussed with the patient that there may be a patient responsible charge related to this service. The patient expressed understanding and agreed to proceed.  Subjective:  Ana Lloyd is a 19 y.o. G1P0000 at [redacted]w[redacted]d being followed for ongoing prenatal care.  She is currently monitored for the following issues for this low-risk pregnancy and has MDD (major depressive disorder); Anxiety disorder of adolescence; Insomnia; Morning sickness; and Supervision of normal first pregnancy, antepartum on their problem list.  Patient reports backache and heartburn. Reports fetal movement. Denies any contractions, bleeding or leaking of fluid.   The following portions of the patient's history were reviewed and updated as appropriate: allergies, current medications, past family history, past medical history, past social history, past surgical history and problem list.   Objective:   General:  Alert, oriented and cooperative.   Mental Status: Normal mood and affect perceived. Normal judgment and thought content.  Rest of physical exam deferred due to type of encounter  Assessment and Plan:  Pregnancy: G1P0000 at [redacted]w[redacted]d 1. Supervision of normal first pregnancy, antepartum  2. Anxiety disorder of adolescence - clinically stable  Preterm labor symptoms and general obstetric precautions including but not limited to vaginal bleeding, contractions, leaking of fluid and fetal movement were reviewed in detail with the patient.  I discussed the assessment and treatment plan with the patient. The patient was  provided an opportunity to ask questions and all were answered. The patient agreed with the plan and demonstrated an understanding of the instructions. The patient was advised to call back or seek an in-person office evaluation/go to MAU at [facility] for any urgent or concerning symptoms. Please refer to After Visit Summary for other counseling recommendations.   I provided 10 minutes of non-face-to-face time during this encounter.  Return in about 2 weeks (around 03/06/2019) for ROB.   Coral Ceo, MD [facility] 02/20/2019

## 2019-03-04 ENCOUNTER — Encounter: Payer: Self-pay | Admitting: Certified Nurse Midwife

## 2019-03-06 ENCOUNTER — Encounter: Payer: Self-pay | Admitting: Certified Nurse Midwife

## 2019-03-06 ENCOUNTER — Ambulatory Visit (INDEPENDENT_AMBULATORY_CARE_PROVIDER_SITE_OTHER): Payer: Managed Care, Other (non HMO) | Admitting: Certified Nurse Midwife

## 2019-03-06 ENCOUNTER — Other Ambulatory Visit (HOSPITAL_COMMUNITY)
Admission: RE | Admit: 2019-03-06 | Discharge: 2019-03-06 | Disposition: A | Discharge status: Home / Self Care | Payer: Managed Care, Other (non HMO) | Source: Ambulatory Visit | Attending: Certified Nurse Midwife | Admitting: Certified Nurse Midwife

## 2019-03-06 ENCOUNTER — Other Ambulatory Visit: Payer: Self-pay

## 2019-03-06 DIAGNOSIS — Z3400 Encounter for supervision of normal first pregnancy, unspecified trimester: Secondary | ICD-10-CM | POA: Diagnosis not present

## 2019-03-06 DIAGNOSIS — Z3403 Encounter for supervision of normal first pregnancy, third trimester: Secondary | ICD-10-CM | POA: Diagnosis not present

## 2019-03-06 DIAGNOSIS — Z3A36 36 weeks gestation of pregnancy: Secondary | ICD-10-CM

## 2019-03-06 NOTE — Progress Notes (Signed)
   PRENATAL VISIT NOTE  Subjective:  Ana Lloyd is a 19 y.o. G1P0000 at [redacted]w[redacted]d being seen today for ongoing prenatal care.  She is currently monitored for the following issues for this low-risk pregnancy and has MDD (major depressive disorder); Anxiety disorder of adolescence; Insomnia; Morning sickness; and Supervision of normal first pregnancy, antepartum on their problem list.  Patient reports no complaints.  Contractions: Irritability. Vag. Bleeding: None.  Movement: Present. Denies leaking of fluid.   The following portions of the patient's history were reviewed and updated as appropriate: allergies, current medications, past family history, past medical history, past social history, past surgical history and problem list.   Objective:   Vitals:   03/06/19 1438  BP: 111/74  Pulse: (!) 118  Weight: 203 lb (92.1 kg)    Fetal Status: Fetal Heart Rate (bpm): 140 Fundal Height: 39 cm Movement: Present  Presentation: Vertex  General:  Alert, oriented and cooperative. Patient is in no acute distress.  Skin: Skin is warm and dry. No rash noted.   Cardiovascular: Normal heart rate noted  Respiratory: Normal respiratory effort, no problems with respiration noted  Abdomen: Soft, gravid, appropriate for gestational age.  Pain/Pressure: Absent     Pelvic: Cervical exam performed Dilation: 2 Effacement (%): 50 Station: Ballotable  Extremities: Normal range of motion.  Edema: None  Mental Status: Normal mood and affect. Normal behavior. Normal judgment and thought content.   Assessment and Plan:  Pregnancy: G1P0000 at [redacted]w[redacted]d 1. Supervision of normal first pregnancy, antepartum - patient doing well, no complaints - routine prenatal care - anticipatory guidance on upcoming appointments  - discussed possible birth plans with patient and answered patient's questions, discussed with patient pain management options during labor and delivery, patient plans IV pain medication and epidural    - recent US on 1/12 at [redacted]w[redacted]d EFW 2268gm (5lb)  -Reviewed safety, visitor policy, reassurance about COVID-19 for pregnancy at this time .  The office remains open if pt needs to be seen and MAU is open 24 hours/day for OB emergencies. - Strep Gp B NAA - Cervicovaginal ancillary only( [place])  Preterm labor symptoms and general obstetric precautions including but not limited to vaginal bleeding, contractions, leaking of fluid and fetal movement were reviewed in detail with the patient. Please refer to After Visit Summary for other counseling recommendations.   Return in about 2 weeks (around 03/20/2019) for ROB-mychart.  Future Appointments  Date Time Provider Department Center  03/21/2019  9:05 AM Gerrit Heck, CNM [facility] None    Sharyon Cable, CNM

## 2019-03-06 NOTE — Patient Instructions (Signed)
Reasons to go to MAU:  1.  Contractions are  5 minutes apart or less, each last 1 minute, these have been going on for 1-2 hours, and you cannot walk or talk during them 2.  You have a large gush of fluid, or a trickle of fluid that will not stop and you have to wear a pad 3.  You have bleeding that is bright red, heavier than spotting--like menstrual bleeding (spotting can be normal in early labor or after a check of your cervix) 4.  You do not feel the baby moving like he/she normally does  

## 2019-03-06 NOTE — Progress Notes (Signed)
Pt is here for ROB and cultures.

## 2019-03-07 LAB — CERVICOVAGINAL ANCILLARY ONLY
Chlamydia: NEGATIVE
Comment: NEGATIVE
Comment: NEGATIVE
Comment: NORMAL
Neisseria Gonorrhea: NEGATIVE
Trichomonas: NEGATIVE

## 2019-03-08 LAB — STREP GP B NAA: Strep Gp B NAA: NEGATIVE

## 2019-03-13 ENCOUNTER — Encounter: Payer: Self-pay | Admitting: Certified Nurse Midwife

## 2019-03-21 ENCOUNTER — Telehealth (INDEPENDENT_AMBULATORY_CARE_PROVIDER_SITE_OTHER): Payer: Self-pay

## 2019-03-21 DIAGNOSIS — Z3400 Encounter for supervision of normal first pregnancy, unspecified trimester: Secondary | ICD-10-CM

## 2019-03-21 DIAGNOSIS — Z3403 Encounter for supervision of normal first pregnancy, third trimester: Secondary | ICD-10-CM

## 2019-03-21 DIAGNOSIS — Z3A38 38 weeks gestation of pregnancy: Secondary | ICD-10-CM

## 2019-03-21 NOTE — Progress Notes (Signed)
Pt presents for ROB c/o pink tinge after wiping x couple of days, denies LOF.

## 2019-03-21 NOTE — Patient Instructions (Signed)

## 2019-03-21 NOTE — Progress Notes (Signed)
   PRENATAL VISIT NOTE  Subjective:  Ana Lloyd is a 19 y.o. G1P0000 at [redacted]w[redacted]d who presents today for routine prenatal care.  She is currently being monitored for supervision of a low-risk pregnancy with problems as listed below.  Patient has no pregnancy related concerns and endorses fetal movement.  She denies vaginal concerns including discharge, bleeding, leaking, itching, and burning. However, she states that when she wipes "their is pink stuff."  She endorses sexual activity in the past 3 days and endorses that she started noticing the discharge after.  She reports contractions every "couple of hours," but endorses that it is not consistent.   Patient reports that she will be bottle feeding and desires PPIUD.  Patient states infant will [facility].   Patient Active Problem List   Diagnosis Date Noted  . Supervision of normal first pregnancy, antepartum 09/04/2018  . Morning sickness 09/03/2018  . 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 MDD (major depressive disorder) 11/19/2015    The following portions of the patient's history were reviewed and updated as appropriate: allergies, current medications, past family history, past medical history, past social history, past surgical history and problem list. Problem list updated.  Objective:   Vitals:   03/21/19 0924  BP: 112/72  Pulse: 99  Temp: 98.1 F (36.7 C)  Weight: 207 lb 12.8 oz (94.3 kg)    Fetal Status: Fetal Heart Rate (bpm): 149 Fundal Height: 40 cm Movement: Present     General:  Alert, oriented and cooperative. Patient is in no acute distress.  Skin: Skin is warm and dry.   Cardiovascular: Regular rate and rhythm.  Respiratory: Normal respiratory effort. CTA-Bilaterally  Abdomen: Soft, gravid, appropriate for gestational age.  Pelvic: Cervical exam performed Dilation: 2.5 Effacement (%): 50 Station: Ballotable  Extremities: Normal range of motion.  Edema:  None  Mental Status: Normal mood and affect. Normal behavior. Normal judgment and thought content.   Assessment and Plan:  Pregnancy: G1P0000 at [redacted]w[redacted]d  1. Supervision of normal first pregnancy, antepartum -Reviewed spotting after sexual activity.  Informed that as long as self-limiting this is expected. -Reviewed contraction pattern conducive for labor evaluation.  -Reviewed VE findings and informed that presenting parts feels like it is cephalic, but no identifying sutures. -Informed that next visit would be virtual, but she would return to office at 40 weeks for monitoring if necessary.   Term labor symptoms and general obstetric precautions including but not limited to vaginal bleeding, contractions, leaking of fluid and fetal movement were reviewed with the patient.  Please refer to After Visit Summary for other counseling recommendations.  Return in about 1 week (around 03/28/2019) for LR-ROB via virutal visit.  No future appointments.  Cherre Robins, CNM 03/21/2019, 9:59 AM

## 2019-03-27 ENCOUNTER — Encounter: Payer: Self-pay | Admitting: Obstetrics

## 2019-03-27 ENCOUNTER — Telehealth (INDEPENDENT_AMBULATORY_CARE_PROVIDER_SITE_OTHER): Payer: Medicaid Other | Admitting: Obstetrics

## 2019-03-27 VITALS — BP 130/75 | HR 133

## 2019-03-27 DIAGNOSIS — Z3403 Encounter for supervision of normal first pregnancy, third trimester: Secondary | ICD-10-CM

## 2019-03-27 DIAGNOSIS — F938 Other childhood emotional disorders: Secondary | ICD-10-CM

## 2019-03-27 DIAGNOSIS — Z3A39 39 weeks gestation of pregnancy: Secondary | ICD-10-CM

## 2019-03-27 DIAGNOSIS — Z3400 Encounter for supervision of normal first pregnancy, unspecified trimester: Secondary | ICD-10-CM

## 2019-03-27 NOTE — Progress Notes (Signed)
   TELEHEALTH OBSTETRICS PRENATAL VIRTUAL VIDEO VISIT ENCOUNTER NOTE  Provider location: Center for [facility]   I connected with Ana Lloyd on 03/27/19 at 11:15 AM EST by OB MyChart Video Encounter at home and verified that I am speaking with the correct person using two identifiers.   I discussed the limitations, risks, security and privacy concerns of performing an evaluation and management service virtually and the availability of in person appointments. I also discussed with the patient that there may be a patient responsible charge related to this service. The patient expressed understanding and agreed to proceed. Subjective:  Ana Lloyd is a 19 y.o. G1P0000 at [redacted]w[redacted]d being seen today for ongoing prenatal care.  She is currently monitored for the following issues for this low-risk pregnancy and has MDD (major depressive disorder); Anxiety disorder of adolescence; Insomnia; Morning sickness; and Supervision of normal first pregnancy, antepartum on their problem list.  Patient reports heartburn.  Contractions: Irregular. Vag. Bleeding: None.  Movement: Present. Denies any leaking of fluid.   The following portions of the patient's history were reviewed and updated as appropriate: allergies, current medications, past family history, past medical history, past social history, past surgical history and problem list.   Objective:   Vitals:   03/27/19 1149  BP: 130/75  Pulse: (!) 133    Fetal Status:     Movement: Present     General:  Alert, oriented and cooperative. Patient is in no acute distress.  Respiratory: Normal respiratory effort, no problems with respiration noted  Mental Status: Normal mood and affect. Normal behavior. Normal judgment and thought content.  Rest of physical exam deferred due to type of encounter  Imaging: No results found.  Assessment and Plan:  Pregnancy: G1P0000 at [redacted]w[redacted]d 1. Supervision of normal first pregnancy,  antepartum  2. Anxiety disorder of adolescence - clinically stable  Term labor symptoms and general obstetric precautions including but not limited to vaginal bleeding, contractions, leaking of fluid and fetal movement were reviewed in detail with the patient. I discussed the assessment and treatment plan with the patient. The patient was provided an opportunity to ask questions and all were answered. The patient agreed with the plan and demonstrated an understanding of the instructions. The patient was advised to call back or seek an in-person office evaluation/go to MAU at [facility] for any urgent or concerning symptoms. Please refer to After Visit Summary for other counseling recommendations.   I provided 10 minutes of face-to-face time during this encounter.  Return in about 1 week (around 04/03/2019) for ROB.  Prefers female providers .   Coral Ceo, MD Center for [facility], [facility] 03/27/2019

## 2019-03-28 ENCOUNTER — Telehealth: Payer: Medicaid Other | Admitting: Obstetrics

## 2019-04-02 ENCOUNTER — Inpatient Hospital Stay (EMERGENCY_DEPARTMENT_HOSPITAL)
Admission: AD | Admit: 2019-04-02 | Discharge: 2019-04-02 | Disposition: A | Discharge status: Home / Self Care | Payer: Medicaid Other | Source: Home / Self Care | Attending: Obstetrics and Gynecology | Admitting: Obstetrics and Gynecology

## 2019-04-02 ENCOUNTER — Encounter: Payer: Self-pay | Admitting: Certified Nurse Midwife

## 2019-04-02 ENCOUNTER — Encounter (HOSPITAL_COMMUNITY): Payer: Self-pay | Admitting: Obstetrics and Gynecology

## 2019-04-02 ENCOUNTER — Other Ambulatory Visit: Payer: Self-pay

## 2019-04-02 DIAGNOSIS — Z3A40 40 weeks gestation of pregnancy: Secondary | ICD-10-CM | POA: Diagnosis not present

## 2019-04-02 DIAGNOSIS — Z0371 Encounter for suspected problem with amniotic cavity and membrane ruled out: Secondary | ICD-10-CM

## 2019-04-02 LAB — POCT FERN TEST
POCT Fern Test: NEGATIVE
POCT Fern Test: NEGATIVE

## 2019-04-02 NOTE — MAU Provider Note (Signed)
First Provider Initiated Contact with Patient 04/02/19 1724       S: Ana Lloyd is a 19 y.o. G1P0000 at [redacted]w[redacted]d  who presents to MAU today complaining of leaking of fluid since 5 am this morning. She denies vaginal bleeding. She denies contractions. She reports normal fetal movement. Had decreased movement earlier today but reports normal movement now.   O: BP 119/65 (BP Location: Right Arm)   Pulse (!) 104   Temp 98.1 F (36.7 C) (Oral)   Resp 18   Ht 5\' 5"  (1.651 m)   Wt 95 kg   LMP 06/24/2018   SpO2 98%   BMI 34.86 kg/m  GENERAL: Well-developed, well-nourished female in no acute distress.  HEAD: Normocephalic, atraumatic.  CHEST: Normal effort of breathing, regular heart rate ABDOMEN: Soft, nontender, gravid PELVIC: Normal external female genitalia. Vagina is pink and rugated. Cervix with normal contour, no lesions. Normal discharge.  no pooling.   Cervical exam: deferred     Fetal Monitoring: Baseline: 135 Variability: moderate Accelerations: 15x15 Decelerations: none Contractions: none  Results for orders placed or performed during the hospital encounter of 04/02/19 (from the past 24 hour(s))  POCT fern test     Status: None   Collection Time: 04/02/19  5:33 PM  Result Value Ref Range   POCT Fern Test Negative = intact amniotic membranes    -Reviewed with Dr. 04/04/19 due to report of decreased fetal movement earlier today. Patient now reports good fetal movement & had a reactive tracing in MAU. Will discharge home. Patient has follow up in the office tomorrow.    A: SIUP at [redacted]w[redacted]d  Membranes intact  P: Discharged home  Fetal movement form Keep appointment in the office tomorrow  [redacted]w[redacted]d, NP 04/02/2019 5:39 PM

## 2019-04-02 NOTE — MAU Note (Signed)
Been leaking fluid, noted a spot in her bed when she woke at 0500. Clear fluids, still coming. No bleeding. Been having pain in the top of her vagina, comes and goes, every 10-57min.

## 2019-04-02 NOTE — Discharge Instructions (Signed)
Fetal Movement Counts Patient Name: ________________________________________________ Patient Due Date: ____________________ What is a fetal movement count?  A fetal movement count is the number of times that you feel your baby move during a certain amount of time. This may also be called a fetal kick count. A fetal movement count is recommended for every pregnant woman. You may be asked to start counting fetal movements as early as week 28 of your pregnancy. Pay attention to when your baby is most active. You may notice your baby's sleep and wake cycles. You may also notice things that make your baby move more. You should do a fetal movement count:  When your baby is normally most active.  At the same time each day. A good time to count movements is while you are resting, after having something to eat and drink. How do I count fetal movements? 1. Find a quiet, comfortable area. Sit, or lie down on your side. 2. Write down the date, the start time and stop time, and the number of movements that you felt between those two times. Take this information with you to your health care visits. 3. Write down your start time when you feel the first movement. 4. Count kicks, flutters, swishes, rolls, and jabs. You should feel at least 10 movements. 5. You may stop counting after you have felt 10 movements, or if you have been counting for 2 hours. Write down the stop time. 6. If you do not feel 10 movements in 2 hours, contact your health care provider for further instructions. Your health care provider may want to do additional tests to assess your baby's well-being. Contact a health care provider if:  You feel fewer than 10 movements in 2 hours.  Your baby is not moving like he or she usually does. Date: ____________ Start time: ____________ Stop time: ____________ Movements: ____________ Date: ____________ Start time: ____________ Stop time: ____________ Movements: ____________ Date: ____________  Start time: ____________ Stop time: ____________ Movements: ____________ Date: ____________ Start time: ____________ Stop time: ____________ Movements: ____________ Date: ____________ Start time: ____________ Stop time: ____________ Movements: ____________ Date: ____________ Start time: ____________ Stop time: ____________ Movements: ____________ Date: ____________ Start time: ____________ Stop time: ____________ Movements: ____________ Date: ____________ Start time: ____________ Stop time: ____________ Movements: ____________ Date: ____________ Start time: ____________ Stop time: ____________ Movements: ____________ This information is not intended to replace advice given to you by your health care provider. Make sure you discuss any questions you have with your health care provider. Document Revised: 09/13/2018 Document Reviewed: 09/13/2018 Elsevier Patient Education  2020 [employer]  

## 2019-04-03 ENCOUNTER — Encounter: Payer: Managed Care, Other (non HMO) | Admitting: Advanced Practice Midwife

## 2019-04-03 ENCOUNTER — Inpatient Hospital Stay (HOSPITAL_COMMUNITY): Payer: Medicaid Other | Admitting: Anesthesiology

## 2019-04-03 ENCOUNTER — Encounter (HOSPITAL_COMMUNITY): Payer: Self-pay | Admitting: Family Medicine

## 2019-04-03 ENCOUNTER — Inpatient Hospital Stay (HOSPITAL_COMMUNITY)
Admission: AD | Admit: 2019-04-03 | Discharge: 2019-04-05 | DRG: 786 | Disposition: A | Discharge status: Home / Self Care | Payer: Medicaid Other | Attending: Family Medicine | Admitting: Family Medicine

## 2019-04-03 ENCOUNTER — Encounter (HOSPITAL_COMMUNITY)
Admission: AD | Disposition: A | Discharge status: Home / Self Care | Payer: Self-pay | Source: Ambulatory Visit | Attending: Family Medicine

## 2019-04-03 ENCOUNTER — Inpatient Hospital Stay (HOSPITAL_BASED_OUTPATIENT_CLINIC_OR_DEPARTMENT_OTHER): Payer: Medicaid Other

## 2019-04-03 DIAGNOSIS — O4590 Premature separation of placenta, unspecified, unspecified trimester: Secondary | ICD-10-CM

## 2019-04-03 DIAGNOSIS — Z98891 History of uterine scar from previous surgery: Secondary | ICD-10-CM

## 2019-04-03 DIAGNOSIS — O365930 Maternal care for other known or suspected poor fetal growth, third trimester, not applicable or unspecified: Secondary | ICD-10-CM | POA: Diagnosis not present

## 2019-04-03 DIAGNOSIS — O4593 Premature separation of placenta, unspecified, third trimester: Secondary | ICD-10-CM | POA: Diagnosis not present

## 2019-04-03 DIAGNOSIS — O26893 Other specified pregnancy related conditions, third trimester: Secondary | ICD-10-CM | POA: Diagnosis present

## 2019-04-03 DIAGNOSIS — Z23 Encounter for immunization: Secondary | ICD-10-CM | POA: Diagnosis not present

## 2019-04-03 DIAGNOSIS — O9902 Anemia complicating childbirth: Secondary | ICD-10-CM | POA: Diagnosis not present

## 2019-04-03 DIAGNOSIS — Z87891 Personal history of nicotine dependence: Secondary | ICD-10-CM | POA: Diagnosis not present

## 2019-04-03 DIAGNOSIS — Z3A40 40 weeks gestation of pregnancy: Secondary | ICD-10-CM | POA: Diagnosis not present

## 2019-04-03 DIAGNOSIS — O368390 Maternal care for abnormalities of the fetal heart rate or rhythm, unspecified trimester, not applicable or unspecified: Secondary | ICD-10-CM

## 2019-04-03 DIAGNOSIS — D649 Anemia, unspecified: Secondary | ICD-10-CM | POA: Diagnosis present

## 2019-04-03 DIAGNOSIS — Z0371 Encounter for suspected problem with amniotic cavity and membrane ruled out: Secondary | ICD-10-CM | POA: Diagnosis not present

## 2019-04-03 DIAGNOSIS — Z3A39 39 weeks gestation of pregnancy: Secondary | ICD-10-CM

## 2019-04-03 DIAGNOSIS — O210 Mild hyperemesis gravidarum: Secondary | ICD-10-CM

## 2019-04-03 DIAGNOSIS — Z20822 Contact with and (suspected) exposure to covid-19: Secondary | ICD-10-CM | POA: Diagnosis not present

## 2019-04-03 HISTORY — DX: Premature separation of placenta, unspecified, unspecified trimester: O45.90

## 2019-04-03 LAB — CBC
HCT: 39 % (ref 36.0–46.0)
Hemoglobin: 12.7 g/dL (ref 12.0–15.0)
MCH: 29 pg (ref 26.0–34.0)
MCHC: 32.6 g/dL (ref 30.0–36.0)
MCV: 89 fL (ref 80.0–100.0)
Platelets: 282 10*3/uL (ref 150–400)
RBC: 4.38 MIL/uL (ref 3.87–5.11)
RDW: 13.5 % (ref 11.5–15.5)
WBC: 22 10*3/uL — ABNORMAL HIGH (ref 4.0–10.5)
nRBC: 0 % (ref 0.0–0.2)

## 2019-04-03 LAB — RESPIRATORY PANEL BY RT PCR (FLU A&B, COVID)
Influenza A by PCR: NEGATIVE
Influenza B by PCR: NEGATIVE
SARS Coronavirus 2 by RT PCR: NEGATIVE

## 2019-04-03 LAB — RPR: RPR Ser Ql: NONREACTIVE

## 2019-04-03 LAB — ABO/RH: ABO/RH(D): AB POS

## 2019-04-03 LAB — TYPE AND SCREEN
ABO/RH(D): AB POS
Antibody Screen: NEGATIVE

## 2019-04-03 SURGERY — Surgical Case
Anesthesia: Spinal | Wound class: Clean Contaminated

## 2019-04-03 MED ORDER — INFLUENZA VAC SPLIT QUAD 0.5 ML IM SUSY
0.5000 mL | PREFILLED_SYRINGE | INTRAMUSCULAR | Status: AC
  Administered 2019-04-04: 0.5 mL via INTRAMUSCULAR
  Filled 2019-04-03: qty 0.5

## 2019-04-03 MED ORDER — OXYCODONE HCL 5 MG PO TABS
5.0000 mg | ORAL_TABLET | ORAL | Status: DC | PRN
  Administered 2019-04-04 (×2): 5 mg via ORAL
  Administered 2019-04-04: 10 mg via ORAL
  Administered 2019-04-04: 5 mg via ORAL
  Filled 2019-04-03: qty 2
  Filled 2019-04-03 (×3): qty 1

## 2019-04-03 MED ORDER — LACTATED RINGERS IV SOLN: INTRAVENOUS | Status: DC

## 2019-04-03 MED ORDER — OXYCODONE-ACETAMINOPHEN 5-325 MG PO TABS: 1.0000 | ORAL_TABLET | ORAL | Status: DC | PRN

## 2019-04-03 MED ORDER — MENTHOL 3 MG MT LOZG: 1.0000 | LOZENGE | OROMUCOSAL | Status: DC | PRN

## 2019-04-03 MED ORDER — ONDANSETRON HCL 4 MG/2ML IJ SOLN: 4.0000 mg | Freq: Three times a day (TID) | INTRAMUSCULAR | Status: DC | PRN

## 2019-04-03 MED ORDER — CEFAZOLIN SODIUM-DEXTROSE 2-4 GM/100ML-% IV SOLN
INTRAVENOUS | Status: AC
  Filled 2019-04-03: qty 100

## 2019-04-03 MED ORDER — KETOROLAC TROMETHAMINE 30 MG/ML IJ SOLN
30.0000 mg | Freq: Once | INTRAMUSCULAR | Status: AC | PRN
  Administered 2019-04-03: 30 mg via INTRAVENOUS

## 2019-04-03 MED ORDER — ONDANSETRON HCL 4 MG/2ML IJ SOLN
INTRAMUSCULAR | Status: DC | PRN
  Administered 2019-04-03: 4 mg via INTRAVENOUS

## 2019-04-03 MED ORDER — NALOXONE HCL 0.4 MG/ML IJ SOLN: 0.4000 mg | INTRAMUSCULAR | Status: DC | PRN

## 2019-04-03 MED ORDER — DIPHENHYDRAMINE HCL 50 MG/ML IJ SOLN: 12.5000 mg | INTRAMUSCULAR | Status: DC | PRN

## 2019-04-03 MED ORDER — SODIUM CHLORIDE 0.9 % IR SOLN
Status: DC | PRN
  Administered 2019-04-03 (×2): 1

## 2019-04-03 MED ORDER — NALBUPHINE HCL 10 MG/ML IJ SOLN: 5.0000 mg | INTRAMUSCULAR | Status: DC | PRN

## 2019-04-03 MED ORDER — MORPHINE SULFATE (PF) 0.5 MG/ML IJ SOLN
INTRAMUSCULAR | Status: AC
  Filled 2019-04-03: qty 10

## 2019-04-03 MED ORDER — KETOROLAC TROMETHAMINE 30 MG/ML IJ SOLN: 30.0000 mg | Freq: Four times a day (QID) | INTRAMUSCULAR | Status: AC | PRN

## 2019-04-03 MED ORDER — KETOROLAC TROMETHAMINE 30 MG/ML IJ SOLN
30.0000 mg | Freq: Four times a day (QID) | INTRAMUSCULAR | Status: AC
  Administered 2019-04-03 (×2): 30 mg via INTRAVENOUS
  Filled 2019-04-03 (×2): qty 1

## 2019-04-03 MED ORDER — METHYLERGONOVINE MALEATE 0.2 MG/ML IJ SOLN
INTRAMUSCULAR | Status: DC | PRN
  Administered 2019-04-03: .2 mg via INTRAMUSCULAR

## 2019-04-03 MED ORDER — ONDANSETRON HCL 4 MG/2ML IJ SOLN
INTRAMUSCULAR | Status: AC
  Filled 2019-04-03: qty 2

## 2019-04-03 MED ORDER — SIMETHICONE 80 MG PO CHEW
80.0000 mg | CHEWABLE_TABLET | Freq: Three times a day (TID) | ORAL | Status: DC
  Administered 2019-04-03 – 2019-04-05 (×7): 80 mg via ORAL
  Filled 2019-04-03 (×5): qty 1

## 2019-04-03 MED ORDER — BUPIVACAINE IN DEXTROSE 0.75-8.25 % IT SOLN
INTRATHECAL | Status: DC | PRN
  Administered 2019-04-03: 1.4 mL via INTRATHECAL

## 2019-04-03 MED ORDER — DEXAMETHASONE SODIUM PHOSPHATE 10 MG/ML IJ SOLN
INTRAMUSCULAR | Status: AC
  Filled 2019-04-03: qty 1

## 2019-04-03 MED ORDER — TETANUS-DIPHTH-ACELL PERTUSSIS 5-2.5-18.5 LF-MCG/0.5 IM SUSP: 0.5000 mL | Freq: Once | INTRAMUSCULAR | Status: DC

## 2019-04-03 MED ORDER — SODIUM CHLORIDE 0.9% FLUSH: 3.0000 mL | INTRAVENOUS | Status: DC | PRN

## 2019-04-03 MED ORDER — NALBUPHINE HCL 10 MG/ML IJ SOLN: 5.0000 mg | Freq: Once | INTRAMUSCULAR | Status: DC | PRN

## 2019-04-03 MED ORDER — ACETAMINOPHEN 325 MG PO TABS: 650.0000 mg | ORAL_TABLET | ORAL | Status: DC | PRN

## 2019-04-03 MED ORDER — ONDANSETRON HCL 4 MG/2ML IJ SOLN: 4.0000 mg | Freq: Four times a day (QID) | INTRAMUSCULAR | Status: DC | PRN

## 2019-04-03 MED ORDER — PHENYLEPHRINE HCL-NACL 20-0.9 MG/250ML-% IV SOLN
INTRAVENOUS | Status: AC
  Filled 2019-04-03: qty 250

## 2019-04-03 MED ORDER — DEXAMETHASONE SODIUM PHOSPHATE 10 MG/ML IJ SOLN
INTRAMUSCULAR | Status: DC | PRN
  Administered 2019-04-03: 10 mg via INTRAVENOUS

## 2019-04-03 MED ORDER — FENTANYL CITRATE (PF) 100 MCG/2ML IJ SOLN: 100.0000 ug | Freq: Once | INTRAMUSCULAR | Status: DC

## 2019-04-03 MED ORDER — SIMETHICONE 80 MG PO CHEW: 80.0000 mg | CHEWABLE_TABLET | ORAL | Status: DC | PRN

## 2019-04-03 MED ORDER — OXYTOCIN 40 UNITS IN NORMAL SALINE INFUSION - SIMPLE MED: 2.5000 [IU]/h | INTRAVENOUS | Status: AC

## 2019-04-03 MED ORDER — DIPHENHYDRAMINE HCL 25 MG PO CAPS: 25.0000 mg | ORAL_CAPSULE | ORAL | Status: DC | PRN

## 2019-04-03 MED ORDER — ACETAMINOPHEN 500 MG PO TABS
1000.0000 mg | ORAL_TABLET | Freq: Four times a day (QID) | ORAL | Status: AC
  Administered 2019-04-03 (×3): 1000 mg via ORAL
  Filled 2019-04-03 (×3): qty 2

## 2019-04-03 MED ORDER — OXYTOCIN BOLUS FROM INFUSION: 500.0000 mL | Freq: Once | INTRAVENOUS | Status: DC

## 2019-04-03 MED ORDER — KETOROLAC TROMETHAMINE 30 MG/ML IJ SOLN
INTRAMUSCULAR | Status: AC
  Filled 2019-04-03: qty 1

## 2019-04-03 MED ORDER — PROMETHAZINE HCL 25 MG/ML IJ SOLN: 6.2500 mg | INTRAMUSCULAR | Status: DC | PRN

## 2019-04-03 MED ORDER — ENOXAPARIN SODIUM 60 MG/0.6ML ~~LOC~~ SOLN
50.0000 mg | SUBCUTANEOUS | Status: DC
  Administered 2019-04-03 – 2019-04-04 (×2): 50 mg via SUBCUTANEOUS
  Filled 2019-04-03 (×2): qty 0.6

## 2019-04-03 MED ORDER — OXYTOCIN 40 UNITS IN NORMAL SALINE INFUSION - SIMPLE MED
INTRAVENOUS | Status: AC
  Filled 2019-04-03: qty 1000

## 2019-04-03 MED ORDER — WITCH HAZEL-GLYCERIN EX PADS: 1.0000 "application " | MEDICATED_PAD | CUTANEOUS | Status: DC | PRN

## 2019-04-03 MED ORDER — OXYTOCIN 40 UNITS IN NORMAL SALINE INFUSION - SIMPLE MED: 2.5000 [IU]/h | INTRAVENOUS | Status: DC

## 2019-04-03 MED ORDER — MEPERIDINE HCL 25 MG/ML IJ SOLN: 6.2500 mg | INTRAMUSCULAR | Status: DC | PRN

## 2019-04-03 MED ORDER — NALOXONE HCL 4 MG/10ML IJ SOLN
1.0000 ug/kg/h | INTRAVENOUS | Status: DC | PRN
  Filled 2019-04-03: qty 5

## 2019-04-03 MED ORDER — PRENATAL MULTIVITAMIN CH
1.0000 | ORAL_TABLET | Freq: Every day | ORAL | Status: DC
  Administered 2019-04-04: 1 via ORAL
  Filled 2019-04-03: qty 1

## 2019-04-03 MED ORDER — PHENYLEPHRINE HCL-NACL 20-0.9 MG/250ML-% IV SOLN
INTRAVENOUS | Status: DC | PRN
  Administered 2019-04-03: 60 ug/min via INTRAVENOUS

## 2019-04-03 MED ORDER — FENTANYL CITRATE (PF) 100 MCG/2ML IJ SOLN
INTRAMUSCULAR | Status: AC
  Filled 2019-04-03: qty 2

## 2019-04-03 MED ORDER — DIPHENHYDRAMINE HCL 25 MG PO CAPS: 25.0000 mg | ORAL_CAPSULE | Freq: Four times a day (QID) | ORAL | Status: DC | PRN

## 2019-04-03 MED ORDER — OXYCODONE-ACETAMINOPHEN 5-325 MG PO TABS: 2.0000 | ORAL_TABLET | ORAL | Status: DC | PRN

## 2019-04-03 MED ORDER — COCONUT OIL OIL: 1.0000 "application " | TOPICAL_OIL | Status: DC | PRN

## 2019-04-03 MED ORDER — HYDROMORPHONE HCL 1 MG/ML IJ SOLN: 0.2500 mg | INTRAMUSCULAR | Status: DC | PRN

## 2019-04-03 MED ORDER — OXYTOCIN 40 UNITS IN NORMAL SALINE INFUSION - SIMPLE MED
INTRAVENOUS | Status: DC | PRN
  Administered 2019-04-03: 500 mL via INTRAVENOUS

## 2019-04-03 MED ORDER — IBUPROFEN 800 MG PO TABS
800.0000 mg | ORAL_TABLET | Freq: Four times a day (QID) | ORAL | Status: DC
  Administered 2019-04-04 – 2019-04-05 (×5): 800 mg via ORAL
  Filled 2019-04-03 (×5): qty 1

## 2019-04-03 MED ORDER — METHYLERGONOVINE MALEATE 0.2 MG/ML IJ SOLN
INTRAMUSCULAR | Status: AC
  Filled 2019-04-03: qty 1

## 2019-04-03 MED ORDER — ENOXAPARIN SODIUM 40 MG/0.4ML ~~LOC~~ SOLN: 40.0000 mg | SUBCUTANEOUS | Status: DC

## 2019-04-03 MED ORDER — LIDOCAINE HCL (PF) 1 % IJ SOLN: 30.0000 mL | INTRAMUSCULAR | Status: DC | PRN

## 2019-04-03 MED ORDER — SCOPOLAMINE 1 MG/3DAYS TD PT72
MEDICATED_PATCH | TRANSDERMAL | Status: AC
  Filled 2019-04-03: qty 1

## 2019-04-03 MED ORDER — FENTANYL CITRATE (PF) 100 MCG/2ML IJ SOLN
INTRAMUSCULAR | Status: DC | PRN
  Administered 2019-04-03: 15 ug via INTRAVENOUS
  Administered 2019-04-03: 85 ug via INTRAVENOUS

## 2019-04-03 MED ORDER — FENTANYL CITRATE (PF) 100 MCG/2ML IJ SOLN: 100.0000 ug | INTRAMUSCULAR | Status: DC | PRN

## 2019-04-03 MED ORDER — ZOLPIDEM TARTRATE 5 MG PO TABS: 5.0000 mg | ORAL_TABLET | Freq: Every evening | ORAL | Status: DC | PRN

## 2019-04-03 MED ORDER — DIBUCAINE (PERIANAL) 1 % EX OINT: 1.0000 "application " | TOPICAL_OINTMENT | CUTANEOUS | Status: DC | PRN

## 2019-04-03 MED ORDER — SENNOSIDES-DOCUSATE SODIUM 8.6-50 MG PO TABS
2.0000 | ORAL_TABLET | ORAL | Status: DC
  Administered 2019-04-03 – 2019-04-04 (×2): 2 via ORAL
  Filled 2019-04-03 (×2): qty 2

## 2019-04-03 MED ORDER — SOD CITRATE-CITRIC ACID 500-334 MG/5ML PO SOLN: 30.0000 mL | ORAL | Status: DC | PRN

## 2019-04-03 MED ORDER — SIMETHICONE 80 MG PO CHEW
80.0000 mg | CHEWABLE_TABLET | ORAL | Status: DC
  Administered 2019-04-03: 80 mg via ORAL
  Filled 2019-04-03 (×2): qty 1

## 2019-04-03 MED ORDER — SCOPOLAMINE 1 MG/3DAYS TD PT72
1.0000 | MEDICATED_PATCH | Freq: Once | TRANSDERMAL | Status: DC
  Administered 2019-04-03: 1.5 mg via TRANSDERMAL

## 2019-04-03 MED ORDER — CEFAZOLIN SODIUM-DEXTROSE 2-3 GM-%(50ML) IV SOLR
INTRAVENOUS | Status: DC | PRN
  Administered 2019-04-03: 2 g via INTRAVENOUS

## 2019-04-03 MED ORDER — LACTATED RINGERS IV SOLN: 500.0000 mL | INTRAVENOUS | Status: DC | PRN

## 2019-04-03 MED ORDER — MORPHINE SULFATE (PF) 0.5 MG/ML IJ SOLN
INTRAMUSCULAR | Status: DC | PRN
  Administered 2019-04-03: .15 mg via EPIDURAL

## 2019-04-03 MED ORDER — CEFAZOLIN SODIUM-DEXTROSE 2-3 GM-%(50ML) IV SOLR: INTRAVENOUS | Status: DC | PRN

## 2019-04-03 SURGICAL SUPPLY — 36 items
BENZOIN TINCTURE PRP APPL 2/3 (GAUZE/BANDAGES/DRESSINGS) ×3 IMPLANT
CHLORAPREP W/TINT 26ML (MISCELLANEOUS) ×3 IMPLANT
CLAMP CORD UMBIL (MISCELLANEOUS) IMPLANT
CLOSURE STERI STRIP 1/2 X4 (GAUZE/BANDAGES/DRESSINGS) ×1 IMPLANT
CLOSURE WOUND 1/2 X4 (GAUZE/BANDAGES/DRESSINGS) ×1
CLOTH BEACON ORANGE TIMEOUT ST (SAFETY) ×3 IMPLANT
DRSG OPSITE POSTOP 4X10 (GAUZE/BANDAGES/DRESSINGS) ×3 IMPLANT
ELECT REM PT RETURN 9FT ADLT (ELECTROSURGICAL) ×3
ELECTRODE REM PT RTRN 9FT ADLT (ELECTROSURGICAL) ×1 IMPLANT
EXTRACTOR VACUUM M CUP 4 TUBE (SUCTIONS) IMPLANT
EXTRACTOR VACUUM M CUP 4' TUBE (SUCTIONS)
GLOVE BIOGEL PI IND STRL 7.0 (GLOVE) ×2 IMPLANT
GLOVE BIOGEL PI IND STRL 7.5 (GLOVE) ×2 IMPLANT
GLOVE BIOGEL PI INDICATOR 7.0 (GLOVE) ×4
GLOVE BIOGEL PI INDICATOR 7.5 (GLOVE) ×4
GLOVE ECLIPSE 7.5 STRL STRAW (GLOVE) ×3 IMPLANT
GOWN STRL REUS W/TWL LRG LVL3 (GOWN DISPOSABLE) ×9 IMPLANT
KIT ABG SYR 3ML LUER SLIP (SYRINGE) IMPLANT
NDL HYPO 25X5/8 SAFETYGLIDE (NEEDLE) IMPLANT
NEEDLE HYPO 25X5/8 SAFETYGLIDE (NEEDLE) ×3 IMPLANT
NS IRRIG 1000ML POUR BTL (IV SOLUTION) ×3 IMPLANT
PACK C SECTION WH (CUSTOM PROCEDURE TRAY) ×3 IMPLANT
PAD ABD 7.5X8 STRL (GAUZE/BANDAGES/DRESSINGS) ×2 IMPLANT
PAD OB MATERNITY 4.3X12.25 (PERSONAL CARE ITEMS) ×3 IMPLANT
PENCIL SMOKE EVAC W/HOLSTER (ELECTROSURGICAL) ×3 IMPLANT
RTRCTR C-SECT PINK 25CM LRG (MISCELLANEOUS) ×3 IMPLANT
SPONGE GAUZE 4X4 12PLY STER LF (GAUZE/BANDAGES/DRESSINGS) ×4 IMPLANT
STRIP CLOSURE SKIN 1/2X4 (GAUZE/BANDAGES/DRESSINGS) ×2 IMPLANT
SUT VIC AB 0 CTX 36 (SUTURE) ×6
SUT VIC AB 0 CTX36XBRD ANBCTRL (SUTURE) ×3 IMPLANT
SUT VIC AB 2-0 CT1 27 (SUTURE) ×2
SUT VIC AB 2-0 CT1 TAPERPNT 27 (SUTURE) ×1 IMPLANT
SUT VIC AB 4-0 KS 27 (SUTURE) ×5 IMPLANT
TOWEL OR 17X24 6PK STRL BLUE (TOWEL DISPOSABLE) ×3 IMPLANT
TRAY FOLEY W/BAG SLVR 14FR LF (SET/KITS/TRAYS/PACK) ×3 IMPLANT
WATER STERILE IRR 1000ML POUR (IV SOLUTION) ×3 IMPLANT

## 2019-04-03 NOTE — Progress Notes (Addendum)
Talked to Jerilynn Birkenhead about pt having oxy ordered and pt has an allergy to hydrocodone if we could give her something else for pain. She said to ask the pt if she was comfortable taking it and that she was fine with patient trying it. Any other medications will have similar side effect. If pt does not want to take that then she can continue with tylenol and motrin.

## 2019-04-03 NOTE — Transfer of Care (Signed)
Immediate Anesthesia Transfer of Care Note  Patient: Ana Lloyd  Procedure(s) Performed: CESAREAN SECTION (N/A )  Patient Location: PACU  Anesthesia Type:Spinal  Level of Consciousness: awake, alert  and oriented  Airway & Oxygen Therapy: Patient Spontanous Breathing  Post-op Assessment: Report given to RN and Post -op Vital signs reviewed and stable  Post vital signs: Reviewed and stable  Last Vitals:  Vitals Value Taken Time  BP 109/56 04/03/19 0648  Temp    Pulse 98 04/03/19 0650  Resp 24 04/03/19 0650  SpO2 98 % 04/03/19 0650  Vitals shown include unvalidated device data.  Last Pain: There were no vitals filed for this visit.       Complications: No apparent anesthesia complications

## 2019-04-03 NOTE — Anesthesia Procedure Notes (Signed)
Spinal  Patient location during procedure: OR Start time: 04/03/2019 5:43 AM End time: 04/03/2019 5:45 AM Staffing Performed: anesthesiologist  Anesthesiologist: Leilani Able, MD Preanesthetic Checklist Completed: patient identified, IV checked, site marked, risks and benefits discussed, surgical consent, monitors and equipment checked, pre-op evaluation and timeout performed Spinal Block Patient position: sitting Prep: DuraPrep and site prepped and draped Patient monitoring: continuous pulse ox and blood pressure Approach: midline Location: L3-4 Injection technique: single-shot Needle Needle type: Pencan  Needle gauge: 24 G Needle length: 10 cm Needle insertion depth: 6 cm Assessment Sensory level: T6

## 2019-04-03 NOTE — Op Note (Signed)
Glendoris L Vandehei PROCEDURE DATE: 04/03/2019  PREOPERATIVE DIAGNOSIS: Intrauterine pregnancy at  [redacted]w[redacted]d weeks gestation; non-reassuring fetal status  POSTOPERATIVE DIAGNOSIS: The same  PROCEDURE: Stat Primary Low Transverse Cesarean Section  SURGEON:  Dr. Candelaria Celeste  ASSISTANT:   INDICATIONS: Ana Lloyd is a 19 y.o. G1P0000 at [redacted]w[redacted]d scheduled for cesarean section secondary to non-reassuring fetal status.  The risks of cesarean section discussed with the patient included but were not limited to: bleeding which may require transfusion or reoperation; infection which may require antibiotics; injury to bowel, bladder, ureters or other surrounding organs; injury to the fetus; need for additional procedures including hysterectomy in the event of a life-threatening hemorrhage; placental abnormalities wth subsequent pregnancies, incisional problems, thromboembolic phenomenon and other postoperative/anesthesia complications. The patient concurred with the proposed plan, giving informed written consent for the procedure.    FINDINGS:  Viable female infant in vertex presentation.  Apgars 3 and 8, weight pending.  Bloody amniotic fluid.  Intact placenta, three vessel cord.  Normal uterus, fallopian tubes and ovaries bilaterally. Cord pH 6.83  ANESTHESIA:    Spinal INTRAVENOUS FLUIDS:2000 ml ESTIMATED BLOOD LOSS: 646 ml URINE OUTPUT:  100 ml SPECIMENS: Placenta sent to pathology COMPLICATIONS: None immediate  PROCEDURE IN DETAIL:  The patient received intravenous antibiotics and had sequential compression devices applied to her lower extremities while in the preoperative area.  She was then taken to the operating room where spinal anesthesia was administered and was found to be adequate. She was then placed in a dorsal supine position with a leftward tilt, and prepped and draped in a sterile manner.  A foley catheter was placed into her bladder and attached to constant gravity, which drained  clear fluid throughout.  After an adequate timeout was performed, a Pfannenstiel skin incision was made with scalpel and carried through to the underlying layer of fascia. The fascia was incised in the midline and this incision was extended bilaterally bluntly. The fascia was bluntly dissected off the rectus. The rectus muscles were separated in the midline bluntly and the peritoneum was entered bluntly. A bladder blade was placed to aid in visualization of the uterus.  Attention was turned to the lower uterine segment where a transverse hysterotomy was made with a scalpel and extended bilaterally bluntly. The infant was successfully delivered, and cord was quickly clamped and cut and infant was handed over to awaiting neonatology team. Cord gas was obtained. Uterine massage was then administered and the placenta delivered intact with three-vessel cord. The uterus was then cleared of clot and debris.  The hysterotomy was closed with 0 Vicryl in a running locked fashion, and an imbricating layer was also placed with a 0 Vicryl. A 4cm soft hematoma was noted under the serosa of the right corner of the hysterotomy. This was watched for several minutes and was not expanding. Overall, excellent hemostasis was noted. The abdomen and the pelvis were irrigated, then cleared of all clot and debris and the Jon Gills was removed. Hemostasis was confirmed on all surfaces.  The peritoneum was reapproximated using 2-0 vicryl running stitches. The fascia was then closed using 0 Vicryl in a running fashion. The subcutaneous layer was reapproximated with plain gut and the skin was closed with 4-0 vicryl. The patient tolerated the procedure well. Sponge, lap, instrument and needle counts were correct x 2. She was taken to the recovery room in stable condition.    Levie Heritage, DO 04/03/2019 7:04 AM

## 2019-04-03 NOTE — Anesthesia Preprocedure Evaluation (Addendum)
Anesthesia Evaluation  Patient identified by MRN, date of birth, ID band Patient awake    Reviewed: Allergy & Precautions, NPO status , Patient's Chart, lab work & pertinent test results  Airway Mallampati: III       Dental  (+) Teeth Intact   Pulmonary former smoker,    Pulmonary exam normal        Cardiovascular Normal cardiovascular exam Rhythm:Regular Rate:Bradycardia     Neuro/Psych    GI/Hepatic   Endo/Other    Renal/GU      Musculoskeletal   Abdominal (+) + obese,   Peds  Hematology   Anesthesia Other Findings   Reproductive/Obstetrics                            Anesthesia Physical Anesthesia Plan  ASA: II and emergent  Anesthesia Plan: Spinal   Post-op Pain Management:    Induction:   PONV Risk Score and Plan:   Airway Management Planned: Natural Airway and Nasal Cannula  Additional Equipment: None  Intra-op Plan:   Post-operative Plan:   Informed Consent:     Only emergency history available  Plan Discussed with: CRNA  Anesthesia Plan Comments: (Consent not reviewed because of urgent status. Spinal discussed and pt consented.)       Anesthesia Quick Evaluation

## 2019-04-03 NOTE — H&P (Addendum)
Ana Lloyd is a 19 y.o. female presenting for spontaneous labor. OB History    Gravida  1   Para  0   Term  0   Preterm  0   AB  0   Living  0     SAB  0   TAB  0   Ectopic  0   Multiple  0   Live Births             Past Medical History:  Diagnosis Date  . Acid reflux   . 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 Kidney infection   . Kidney stone    x3   . Obesity   . Strep throat   . UTI (lower urinary tract infection)    Past Surgical History:  Procedure Laterality Date  . ADENOIDECTOMY    . TONSILLECTOMY     Family History: family history includes Diabetes in an other family member; Hypertension in an other family member. Social History:  reports that she has quit smoking. Her smoking use included cigarettes. She smoked 0.50 packs per day. She has never used smokeless tobacco. She reports previous alcohol use. She reports previous drug use. Drug: Marijuana.     Maternal Diabetes: No Genetic Screening: Normal Maternal Ultrasounds/Referrals: IUGR Fetal Ultrasounds or other Referrals:  None Maternal Substance Abuse:  No Significant Maternal Medications:  None Significant Maternal Lab Results:  Group B Strep negative Other Comments:  None  Review of Systems Maternal Medical History:  Reason for admission: Contractions.   Contractions: Onset was 3-5 hours ago.   Frequency: regular.   Duration is approximately 1 minute.   Perceived severity is moderate.    Fetal activity: Perceived fetal activity is normal.    Prenatal complications: IUGR.   Prenatal Complications - Diabetes: none.    Dilation: 7 Last menstrual period 06/24/2018. Maternal Exam:  Uterine Assessment: Contraction strength is moderate.  Contraction duration is 1 minute. Contraction frequency is regular.   Abdomen: Fundal height is term.   Fetal presentation: vertex  Introitus: Normal vulva. Normal vagina.    Fetal Exam Fetal Monitor  Review: Mode: ultrasound and hand-held doppler probe.   Baseline rate: 110.      Physical Exam  Constitutional: She is oriented to person, place, and time. She appears well-developed and well-nourished.  Cardiovascular: Normal rate, regular rhythm and normal heart sounds.  Respiratory: Effort normal and breath sounds normal.  GI: Soft. Bowel sounds are normal.  Genitourinary:    Vulva normal.   Neurological: She is alert and oriented to person, place, and time.  Skin: Skin is warm and dry.  Psychiatric: She has a normal mood and affect. Her behavior is normal. Judgment and thought content normal.    Prenatal labs: ABO, Rh: AB/Positive/-- (08/06 1441) Antibody: Negative (08/06 1441) Rubella: 2.72 (08/06 1441) RPR: Non Reactive (12/02 1126)  HBsAg: Negative (08/06 1441)  HIV: Non Reactive (12/02 1126)  GBS: --Henderson Cloud (01/27 0304)   Assessment/Plan: Heart tone obtained by [place] due to patient moving around a lot, uncomfortable in MAU. FHT 110.  Patient moved to L&D. Baby HR 86.  Fluid bolus, position changes done without improvement of fetal heart tones.  The risks of cesarean section discussed with the patient included but were not limited to: bleeding which may require transfusion or reoperation; infection which may require antibiotics; injury to bowel, bladder, ureters or other surrounding organs; injury to the fetus; need for additional procedures including hysterectomy in  the event of a life-threatening hemorrhage; placental abnormalities wth subsequent pregnancies, incisional problems, thromboembolic phenomenon and other postoperative/anesthesia complications. The patient concurred with the proposed plan, giving informed written consent for the procedure.   Patient has been NPO since last night, she will remain NPO for procedure. Anesthesia and OR aware.  Preoperative prophylactic Ancef ordered on call to the OR.  To OR when ready.  Levie Heritage, DO 04/03/2019 5:39 AM

## 2019-04-03 NOTE — MAU Note (Signed)
States her contractions have felt back to back for the past 2 hours.  Denies any VB.  Some possible LOF.

## 2019-04-04 LAB — CBC
HCT: 23 % — ABNORMAL LOW (ref 36.0–46.0)
Hemoglobin: 7.7 g/dL — ABNORMAL LOW (ref 12.0–15.0)
MCH: 29.5 pg (ref 26.0–34.0)
MCHC: 33.5 g/dL (ref 30.0–36.0)
MCV: 88.1 fL (ref 80.0–100.0)
Platelets: 216 10*3/uL (ref 150–400)
RBC: 2.61 MIL/uL — ABNORMAL LOW (ref 3.87–5.11)
RDW: 13.5 % (ref 11.5–15.5)
WBC: 21.7 10*3/uL — ABNORMAL HIGH (ref 4.0–10.5)
nRBC: 0 % (ref 0.0–0.2)

## 2019-04-04 LAB — CREATININE, SERUM
Creatinine, Ser: 0.66 mg/dL (ref 0.44–1.00)
GFR calc Af Amer: >60 mL/min (ref >60)
GFR calc non Af Amer: >60 mL/min (ref >60)

## 2019-04-04 LAB — SURGICAL PATHOLOGY

## 2019-04-04 MED ORDER — SODIUM CHLORIDE 0.9 % IV SOLN
510.0000 mg | Freq: Once | INTRAVENOUS | Status: AC
  Administered 2019-04-04: 510 mg via INTRAVENOUS
  Filled 2019-04-04: qty 17

## 2019-04-04 NOTE — Progress Notes (Addendum)
CSW received consult for hx of marijuana use.  Referral was screened out due to the following: ~MOB had no documented substance use after initial prenatal visit/+UPT. ~MOB had no positive drug screens after initial prenatal visit/+UPT. ~Baby's UDS is negative.  Please consult CSW if current concerns arise or by MOB's request or if [place] score is over 9.   CSW will monitor CDS results and make report to Child Protective Services if warranted.  Per further chart review. MOB also diagnosed with anxiety/depression in 2017.     Claude Manges Bristyn Kulesza, MSW, LCSW [facility] [phone]

## 2019-04-04 NOTE — Anesthesia Postprocedure Evaluation (Signed)
Anesthesia Post Note  Patient: Ana Lloyd  Procedure(s) Performed: CESAREAN SECTION (N/A )     Patient location during evaluation: PACU Anesthesia Type: Spinal Level of consciousness: awake Pain management: pain level controlled Vital Signs Assessment: post-procedure vital signs reviewed and stable Respiratory status: spontaneous breathing Cardiovascular status: stable Postop Assessment: no headache, no backache, spinal receding, patient able to bend at knees and no apparent nausea or vomiting Anesthetic complications: no    Last Vitals:  Vitals:   04/04/19 0127 04/04/19 0530  BP: (!) 99/43 (!) 95/44  Pulse: 82 71  Resp: 18 16  Temp: 36.7 C 36.8 C  SpO2: 99% 99%    Last Pain:  Vitals:   04/04/19 0738  TempSrc:   PainSc: 6    Pain Goal:                   [name]

## 2019-04-04 NOTE — Progress Notes (Addendum)
POSTPARTUM PROGRESS NOTE  Subjective: Keleigh L Claycomb is a 19 y.o. G1P1001 s/p stat CS at [redacted]w[redacted]d for placental abruption.  She reports she doing well. No acute events overnight. She denies any problems with ambulating, voiding or po intake. Denies nausea or vomiting. Pain is moderately controlled.  Lochia is similar to menses.  Objective: Blood pressure (!) 95/44, pulse 71, temperature 98.2 F (36.8 C), resp. rate 16, weight 94.8 kg, last menstrual period 06/24/2018, SpO2 99 %, unknown if currently breastfeeding.  Physical Exam:  General: alert, cooperative and no distress Chest: no respiratory distress Abdomen: soft, non-tender, clean/dry bandage present  Uterine Fundus: firm, appropriately tender Extremities: No calf swelling or tenderness  Recent Labs    04/03/19 0529 04/04/19 0700  HGB 12.7 7.7*  HCT 39.0 23.0*    Assessment/Plan: Herberta L Edelstein is a 19 y.o. G1P1001 s/p stat CS at [redacted]w[redacted]d for placental abruption.  Routine Postpartum Care: Doing well, pain moderately well-controlled.  -- Continue routine care, lactation support  -- Contraception: IUD at outpatient -- Feeding: Bottle --Anemia - hgb 7.7. Feraheme 510mg .  Dispo: Plan for discharge likely tomorrow.  , DO Resident, [facility]

## 2019-04-05 MED ORDER — OXYCODONE HCL 5 MG PO TABS: 5.0000 mg | ORAL_TABLET | ORAL | 0 refills | Status: DC | PRN

## 2019-04-05 MED ORDER — FERROUS SULFATE 325 (65 FE) MG PO TABS: 325.0000 mg | ORAL_TABLET | Freq: Two times a day (BID) | ORAL | 3 refills | Status: DC

## 2019-04-05 MED ORDER — IBUPROFEN 800 MG PO TABS: 800.0000 mg | ORAL_TABLET | Freq: Four times a day (QID) | ORAL | 0 refills | Status: DC

## 2019-04-05 NOTE — Discharge Summary (Signed)
Postpartum Discharge Summary       Patient Name: Ana Lloyd DOB: 10-30-2000 MRN: [id number]  Date of admission: 04/03/2019 Delivering Provider: Truett Mainland   Date of discharge: 04/05/2019  Admitting diagnosis: Onset (spontaneous) of labor after 35 completed weeks of gestation but before 39 completed weeks gestation, with delivery by (planned) cesarean section [O75.82] Status post cesarean section [Z98.891] Intrauterine pregnancy: [redacted]w[redacted]d    Secondary diagnosis:  Active Problems:   Onset (spontaneous) of labor after 37 completed weeks of gestation but before 313completed weeks gestation, with delivery by (planned) cesarean section   Status post cesarean section   Placental abruption  Additional problems: anemia     Discharge diagnosis: Term Pregnancy Delivered                                                                                                Post partum procedures:ferreheme  Augmentation: n/a  Complications: Placental Abruption  Hospital course:  Onset of Labor With Unplanned C/S  19y.o. yo G1P1001 at 202w3das admitted in [facility] 04/03/2019. Patient had a labor course significant for bradycardia in the 80[s after pt arrived in L&D, no improvement with intrauterine resuscitative measures.  STAT cs, placental abruption noted.  Membrane Rupture Time/Date: 5:51 AM ,04/03/2019   The patient went for cesarean section due to NRFHT, and delivered a Viable infant,04/03/2019  Details of operation can be found in separate operative note. Patient had an uncomplicated postpartum course.  She is ambulating,tolerating a regular diet, passing flatus, and urinating well.  She desires discharge today.  Patient is discharged home in stable condition 04/05/19. Delivery time: 5:51 AM    Magnesium Sulfate received: No BMZ received: No Rhophylac:No MMR:No Transfusion:No  Physical exam  Vitals:   04/04/19 0127 04/04/19 0530 04/04/19 2138 04/05/19 0603  BP: (!)  99/43 (!) 95/44 (!) 105/54 (!) 113/55  Pulse: 82 71 69 62  Resp: '18 16  16  ' Temp: 98.1 F (36.7 C) 98.2 F (36.8 C) 97.9 F (36.6 C) 97.8 F (36.6 C)  TempSrc:   Oral Oral  SpO2: 99% 99%  99%  Weight:       General: alert, cooperative and no distress Lochia: appropriate Uterine Fundus: firm Incision: Healing well with no significant drainage, No significant erythema, Dressing is clean, dry, and intact; bruising noted DVT Evaluation: No evidence of DVT seen on physical exam. Negative Homan's sign. No cords or calf tenderness. No significant calf/ankle edema. Labs: Lab Results  Component Value Date   WBC 21.7 (H) 04/04/2019   HGB 7.7 (L) 04/04/2019   HCT 23.0 (L) 04/04/2019   MCV 88.1 04/04/2019   PLT 216 04/04/2019   CMP Latest Ref Rng & Units 04/04/2019  Glucose 70 - 99 mg/dL -  BUN 6 - 20 mg/dL -  Creatinine 0.44 - 1.00 mg/dL 0.66  Sodium 135 - 145 mmol/L -  Potassium 3.5 - 5.1 mmol/L -  Chloride 98 - 111 mmol/L -  CO2 22 - 32 mmol/L -  Calcium 8.9 - 10.3 mg/dL -  Total Protein 6.5 - 8.1 g/dL -  Total Bilirubin 0.3 - 1.2 mg/dL -  Alkaline Phos 38 - 126 U/L -  AST 15 - 41 U/L -  ALT 0 - 44 U/L -   Edinburgh Score: No flowsheet data found.  Discharge instruction: per After Visit Summary and "Baby and Me Booklet".  After visit meds:  Allergies as of 04/05/2019      Reactions   Bactrim [sulfamethoxazole-trimethoprim] Hives, Itching   Hydrocodone Other (See Comments)   Reaction:  Suicidal thoughts    Tamiflu [oseltamivir Phosphate] Nausea And Vomiting      Medication List    STOP taking these medications   albuterol 108 (90 Base) MCG/ACT inhaler Commonly known as: VENTOLIN HFA   Blood Pressure Kit Devi   calcium carbonate 500 MG chewable tablet Commonly known as: TUMS - dosed in mg elemental calcium     TAKE these medications   ferrous sulfate 325 (65 FE) MG tablet Take 1 tablet (325 mg total) by mouth 2 (two) times daily with a meal.   ibuprofen  800 MG tablet Commonly known as: ADVIL Take 1 tablet (800 mg total) by mouth every 6 (six) hours.   oxyCODONE 5 MG immediate release tablet Commonly known as: Oxy IR/ROXICODONE Take 1-2 tablets (5-10 mg total) by mouth every 4 (four) hours as needed for moderate pain.   prenatal multivitamin Tabs tablet Take 1 tablet by mouth daily at 12 noon.       Diet: routine diet  Activity: Advance as tolerated. Pelvic rest for 6 weeks.   Outpatient follow up:6 weeks Follow up Appt:No future appointments. Follow up Visit: [place]. Schedule an appointment as soon as possible for a visit.   Specialty: Obstetrics and Gynecology Why: 1 week for incision check and 6 weeks for postpartum checkup/IUD placement Contact information: [address] [place] [phone]            Please schedule this patient for Postpartum visit in: 6 weeks with the following provider: Any provider In-Person For C/S patients schedule nurse incision check in weeks 2 weeks: yes Low risk pregnancy complicated by: abruption Delivery mode:  CS Anticipated Birth Control:  IUD PP Procedures needed: Incision check  Schedule Integrated BH visit: no     Newborn Data: Live born female  Birth Weight: 7 lb 6.9 oz (3370 g) APGAR: 3, 8  Newborn Delivery   Birth date/time: 04/03/2019 05:51:00 Delivery type: C-Section, Low Transverse Trial of labor: No C-section categorization: Primary      Baby Feeding: Breast Disposition:home with mother   04/05/2019 Christin Fudge, CNM

## 2019-04-05 NOTE — Discharge Instructions (Signed)
Intrauterine Device Insertion An intrauterine device (IUD) is a medical device that gets inserted into the uterus to prevent pregnancy. It is a small, T-shaped device that has one or two nylon strings hanging down from it. The strings hang out of the lower part of the uterus (cervix) to allow for future IUD removal. There are two types of IUDs available:  Copper IUD. This type of IUD has copper wire wrapped around it. Copper makes the uterus and fallopian tubes produce a fluid that kills sperm. A copper IUD may last up to 10 years.  Hormone IUD. This type of IUD is made of plastic and contains the hormone progestin (synthetic progesterone). The hormone thickens mucus in the cervix and prevents sperm from entering the uterus. It also thins the uterine lining to prevent implantation of a fertilized egg. The hormone can weaken or kill the sperm that get into the uterus. A hormone IUD may last 3-5 years. Tell a health care provider about:  Any allergies you have.  All medicines you are taking, including vitamins, herbs, eye drops, creams, and over-the-counter medicines.  Any problems you or family members have had with anesthetic medicines.  Any blood disorders you have.  Any surgeries you have had.  Any medical conditions you have, including any STIs (sexually transmitted infections) you may have.  Whether you are pregnant or may be pregnant. What are the risks? Generally, this is a safe procedure. However, problems may occur, including:  Infection.  Bleeding.  Allergic reactions to medicines.  Accidental puncture (perforation) of the uterus, or damage to other structures or organs.  Accidental placement of the IUD either in the muscle layer of the uterus (myometrium) or outside the uterus.  The IUD falling out of the uterus (expulsion). This is more common among women who have recently had a child.  Pregnancy that happens in the fallopian tube (ectopic pregnancy).  Infection of  the uterus and fallopian tubes (pelvic inflammatory disease). What happens before the procedure?  Schedule the IUD insertion for when you will have your menstrual period or right after, to make sure you are not pregnant. Placement of the IUD is better tolerated shortly after a menstrual cycle.  Follow instructions from your health care provider about eating or drinking restrictions.  Ask your health care provider about changing or stopping your regular medicines. This is especially important if you are taking diabetes medicines or blood thinners.  You may get a pain reliever to take before the procedure.  You may have tests for: ? Pregnancy. A pregnancy test involves having a urine sample taken. ? STIs. Placing an IUD in someone who has an STI can make the infection worse. ? Cervical cancer. You may have a Pap test to check for this type of cancer. This means collecting cells from your cervix to be examined under a microscope.  You may have a physical exam to determine the size and position of your uterus. The procedure may vary among health care providers and hospitals. What happens during the procedure?  A tool (speculum) will be placed in your vagina and widened so that your health care provider can see your cervix.  Medicine may be applied to your cervix to help lower your risk of infection (antiseptic medicine).  You may be given an anesthetic medicine to numb each side of your cervix (intracervical block or paracervical block). This medicine is usually given by an injection into the cervix.  A tool (uterine sound) will be inserted into your   uterus to determine the length of your uterus and the direction that your uterus may be tilted.  A slim instrument or tube (IUD inserter) that holds the IUD will be inserted into your vagina, through your cervical canal, and into your uterus.  The IUD will be placed in the uterus, and the IUD inserter will be removed.  The strings that are  attached to the IUD will be trimmed so that they lie just below the cervix. The procedure may vary among health care providers and hospitals. What happens after the procedure?  You may have bleeding after the procedure. This is normal. It varies from light bleeding (spotting) for a few days to menstrual-like bleeding.  You may have cramping and pain.  You may feel dizzy or light-headed.  You may have lower back pain. Summary  An intrauterine device (IUD) is a small, T-shaped device that has one or two nylon strings hanging down from it.  Two types of IUDs are available. You may have a copper IUD or a hormone IUD.  Schedule the IUD insertion for when you will have your menstrual period or right after, to make sure you are not pregnant. Placement of the IUD is better tolerated shortly after a menstrual cycle.  You may have bleeding after the procedure. This is normal. It varies from light spotting for a few days to menstrual-like bleeding. This information is not intended to replace advice given to you by your health care provider. Make sure you discuss any questions you have with your health care provider. Document Revised: 01/06/2017 Document Reviewed: 12/16/2015 Elsevier Patient Education  2020 [place]. Cesarean Delivery, Care After This sheet gives you information about how to care for yourself after your procedure. Your health care provider may also give you more specific instructions. If you have problems or questions, contact your health care provider. What can I expect after the procedure? After the procedure, it is common to have:  A small amount of blood or clear fluid coming from the incision.  Some redness, swelling, and pain in your incision area.  Some abdominal pain and soreness.  Vaginal bleeding (lochia). Even though you did not have a vaginal delivery, you will still have vaginal bleeding and discharge.  Pelvic cramps.  Fatigue. You may have pain,  swelling, and discomfort in the tissue between your vagina and your anus (perineum) if:  Your C-section was unplanned, and you were allowed to labor and push.  An incision was made in the area (episiotomy) or the tissue tore during attempted vaginal delivery. Follow these instructions at home: Incision care   Follow instructions from your health care provider about how to take care of your incision. Make sure you: ? Wash your hands with soap and water before you change your bandage (dressing). If soap and water are not available, use hand sanitizer. ? If you have a dressing, change it or remove it as told by your health care provider. ? Leave stitches (sutures), skin staples, skin glue, or adhesive strips in place. These skin closures may need to stay in place for 2 weeks or longer. If adhesive strip edges start to loosen and curl up, you may trim the loose edges. Do not remove adhesive strips completely unless your health care provider tells you to do that.  Check your incision area every day for signs of infection. Check for: ? More redness, swelling, or pain. ? More fluid or blood. ? Warmth. ? Pus or a bad smell.  Do  not take baths, swim, or use a hot tub until your health care provider says it's okay. Ask your health care provider if you can take showers.  When you cough or sneeze, hug a pillow. This helps with pain and decreases the chance of your incision opening up (dehiscing). Do this until your incision heals. Medicines  Take over-the-counter and prescription medicines only as told by your health care provider.  If you were prescribed an antibiotic medicine, take it as told by your health care provider. Do not stop taking the antibiotic even if you start to feel better.  Do not drive or use heavy machinery while taking prescription pain medicine. Lifestyle  Do not drink alcohol. This is especially important if you are breastfeeding or taking pain medicine.  Do not use any  products that contain nicotine or tobacco, such as cigarettes, e-cigarettes, and chewing tobacco. If you need help quitting, ask your health care provider. Eating and drinking  Drink at least 8 eight-ounce glasses of water every day unless told not to by your health care provider. If you breastfeed, you may need to drink even more water.  Eat high-fiber foods every day. These foods may help prevent or relieve constipation. High-fiber foods include: ? Whole grain cereals and breads. ? Brown rice. ? Beans. ? Fresh fruits and vegetables. Activity   If possible, have someone help you care for your baby and help with household activities for at least a few days after you leave the hospital.  Return to your normal activities as told by your health care provider. Ask your health care provider what activities are safe for you.  Rest as much as possible. Try to rest or take a nap while your baby is sleeping.  Do not lift anything that is heavier than 10 lbs (4.5 kg), or the limit that you were told, until your health care provider says that it is safe.  Talk with your health care provider about when you can engage in sexual activity. This may depend on your: ? Risk of infection. ? How fast you heal. ? Comfort and desire to engage in sexual activity. General instructions  Do not use tampons or douches until your health care provider approves.  Wear loose, comfortable clothing and a supportive and well-fitting bra.  Keep your perineum clean and dry. Wipe from front to back when you use the toilet.  If you pass a blood clot, save it and call your health care provider to discuss. Do not flush blood clots down the toilet before you get instructions from your health care provider.  Keep all follow-up visits for you and your baby as told by your health care provider. This is important. Contact a health care provider if:  You have: ? A fever. ? Bad-smelling vaginal discharge. ? Pus or a bad  smell coming from your incision. ? Difficulty or pain when urinating. ? A sudden increase or decrease in the frequency of your bowel movements. ? More redness, swelling, or pain around your incision. ? More fluid or blood coming from your incision. ? A rash. ? Nausea. ? Little or no interest in activities you used to enjoy. ? Questions about caring for yourself or your baby.  Your incision feels warm to the touch.  Your breasts turn red or become painful or hard.  You feel unusually sad or worried.  You vomit.  You pass a blood clot from your vagina.  You urinate more than usual.  You are dizzy  or light-headed. Get help right away if:  You have: ? Pain that does not go away or get better with medicine. ? Chest pain. ? Difficulty breathing. ? Blurred vision or spots in your vision. ? Thoughts about hurting yourself or your baby. ? New pain in your abdomen or in one of your legs. ? A severe headache.  You faint.  You bleed from your vagina so much that you fill more than one sanitary pad in one hour. Bleeding should not be heavier than your heaviest period. Summary  After the procedure, it is common to have pain at your incision site, abdominal cramping, and slight bleeding from your vagina.  Check your incision area every day for signs of infection.  Tell your health care provider about any unusual symptoms.  Keep all follow-up visits for you and your baby as told by your health care provider. This information is not intended to replace advice given to you by your health care provider. Make sure you discuss any questions you have with your health care provider. Document Revised: 08/02/2017 Document Reviewed: 08/02/2017 Elsevier Patient Education  [place].

## 2019-04-17 ENCOUNTER — Other Ambulatory Visit: Payer: Self-pay

## 2019-04-17 ENCOUNTER — Encounter: Payer: Self-pay | Admitting: Obstetrics

## 2019-04-17 ENCOUNTER — Ambulatory Visit (INDEPENDENT_AMBULATORY_CARE_PROVIDER_SITE_OTHER): Payer: Medicaid Other | Admitting: Obstetrics

## 2019-04-17 NOTE — Progress Notes (Signed)
Ana Lloyd is here for a wound check.  Pt denied lower abdominal pain and drainage.  Pt still had the honeycomb bandage on the the wound. Bandage was taken off by Dr. Clearance Coots.  Wound appears closed and healthy.  Pt advised to clean site with warm water and soap but pat dry.  Pt verbalized understanding. -EH/RMA  Patient seen and assessed by nursing staff during this encounter. I have reviewed the chart and agree with the documentation and plan.  Coral Ceo, MD 04/17/2019 11:29 AM

## 2019-05-07 ENCOUNTER — Ambulatory Visit: Payer: Medicaid Other | Admitting: Advanced Practice Midwife

## 2019-06-04 IMAGING — DX DG FOREARM 2V*R*
2 series · 2 of 2 positions shown · non-contrast
Comparison: None.

CLINICAL DATA: pain, injury. Pt stated she was horse-playing with
friends today and she feel and slammed her arm between two chairs.
Pt has pain in her ulnar side forearm proximal to wrist.

EXAM:
RIGHT FOREARM - 2 VIEW

[forearm ap]
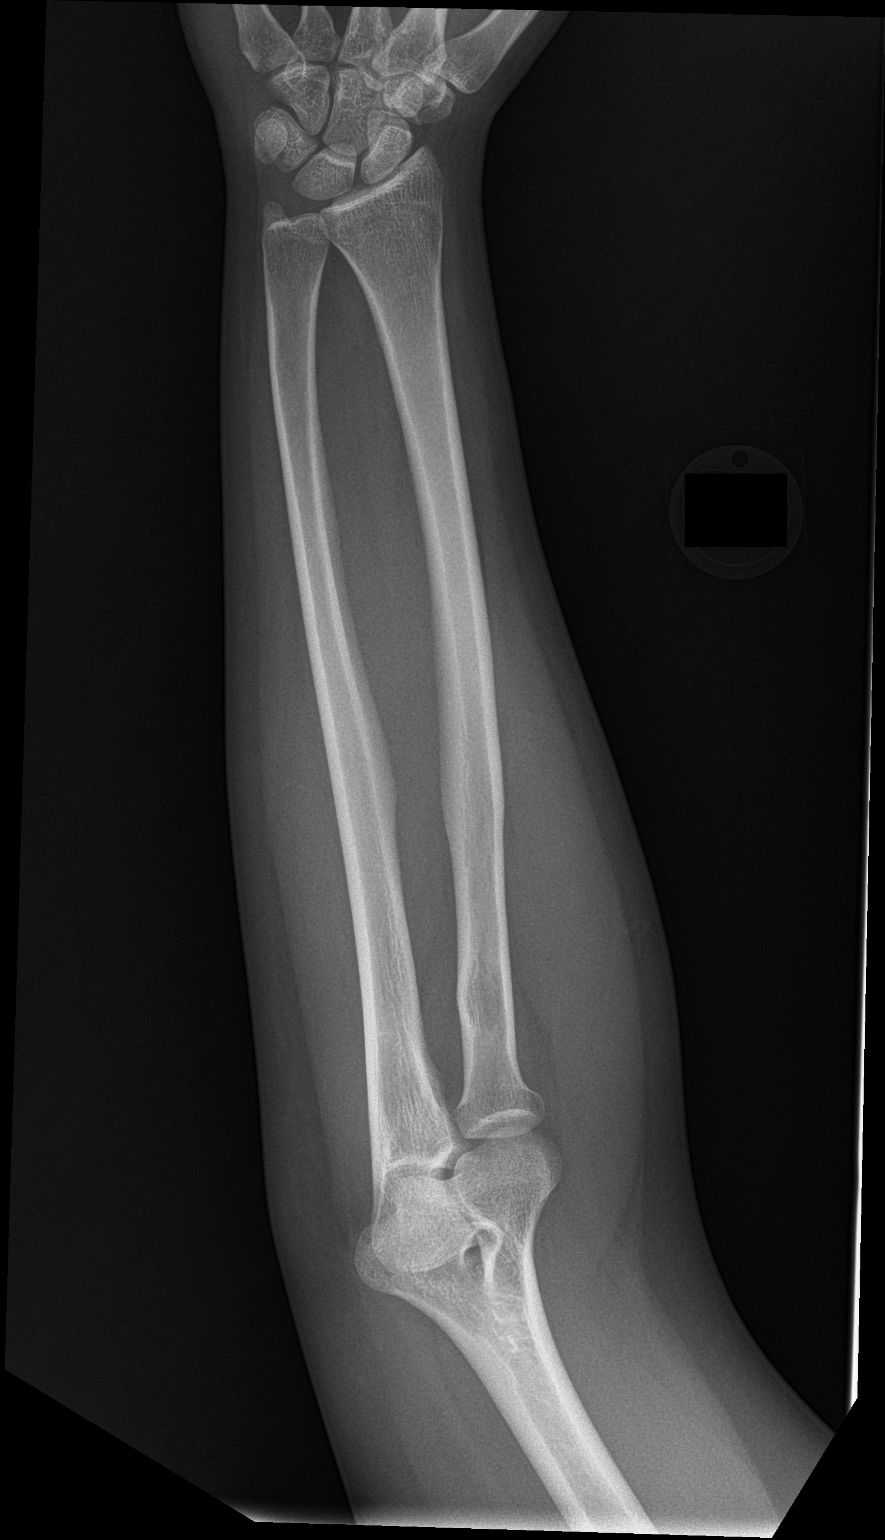

[forearm lat]
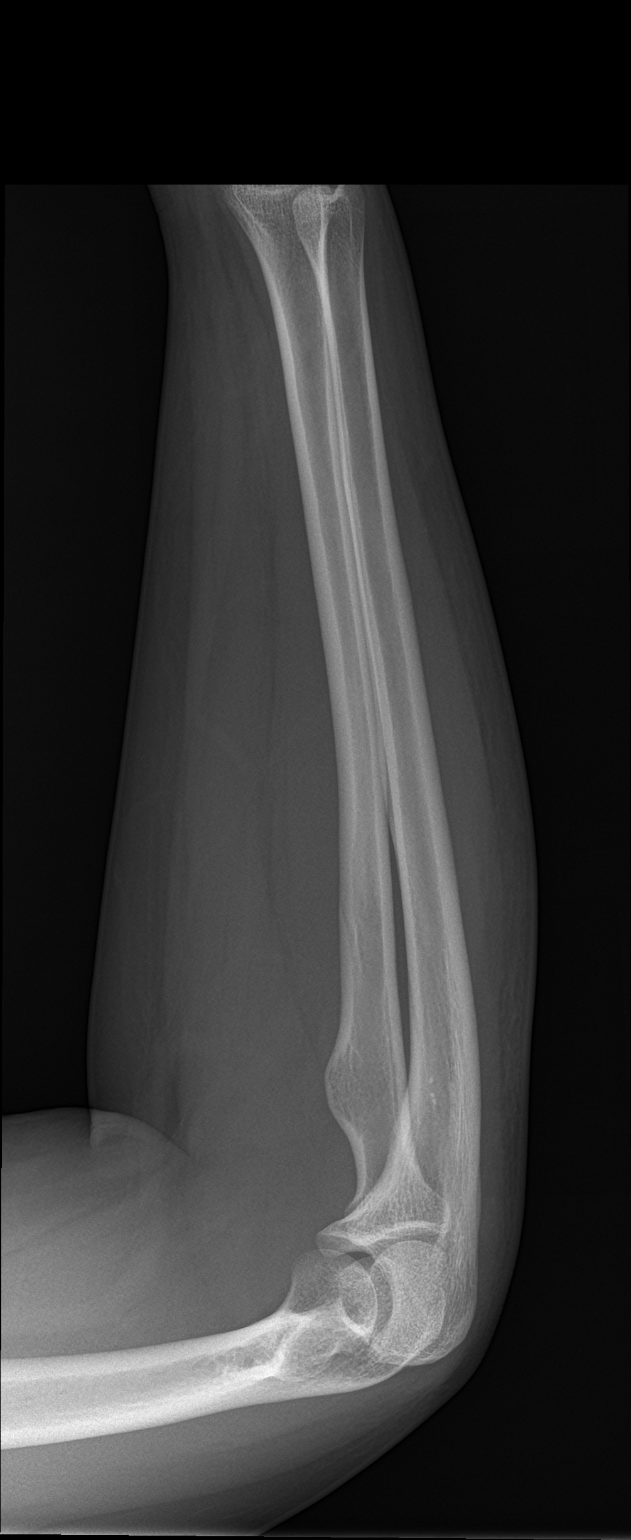

[2 of 2 positions shown; findings below may reference images not displayed]

FINDINGS: No fracture.  No bone lesion.

The wrist and elbow joints are normally spaced and aligned.

There is mild dorsal subcutaneous edema the proximal forearm.
IMPRESSION: No fracture or dislocation.
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
## 2020-03-05 DIAGNOSIS — S92515A Nondisplaced fracture of proximal phalanx of left lesser toe(s), initial encounter for closed fracture: Secondary | ICD-10-CM | POA: Diagnosis not present

## 2020-03-05 DIAGNOSIS — M7989 Other specified soft tissue disorders: Secondary | ICD-10-CM | POA: Diagnosis not present

## 2020-03-12 DIAGNOSIS — S92515D Nondisplaced fracture of proximal phalanx of left lesser toe(s), subsequent encounter for fracture with routine healing: Secondary | ICD-10-CM | POA: Diagnosis not present

## 2020-03-28 DIAGNOSIS — N898 Other specified noninflammatory disorders of vagina: Secondary | ICD-10-CM | POA: Diagnosis not present

## 2020-03-28 DIAGNOSIS — N941 Unspecified dyspareunia: Secondary | ICD-10-CM | POA: Diagnosis not present

## 2020-10-19 DIAGNOSIS — R300 Dysuria: Secondary | ICD-10-CM | POA: Diagnosis not present

## 2020-10-19 DIAGNOSIS — N760 Acute vaginitis: Secondary | ICD-10-CM | POA: Diagnosis not present

## 2020-10-19 DIAGNOSIS — Z113 Encounter for screening for infections with a predominantly sexual mode of transmission: Secondary | ICD-10-CM | POA: Diagnosis not present

## 2021-04-10 IMAGING — US US MFM OB FOLLOW-UP
1 series · 13 of 28 positions shown · non-contrast
Comparison: none

[Series 1: us mfm ob follow-up · 36 acquisitions, 13 frames shown]
[im 2/36]
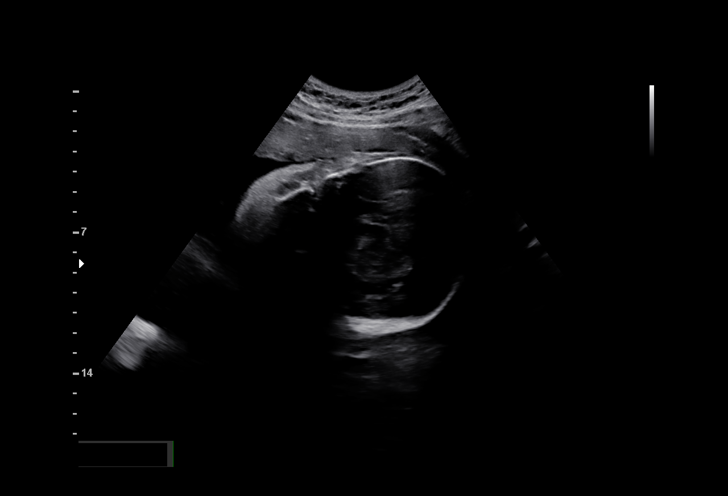
[im 4/36]
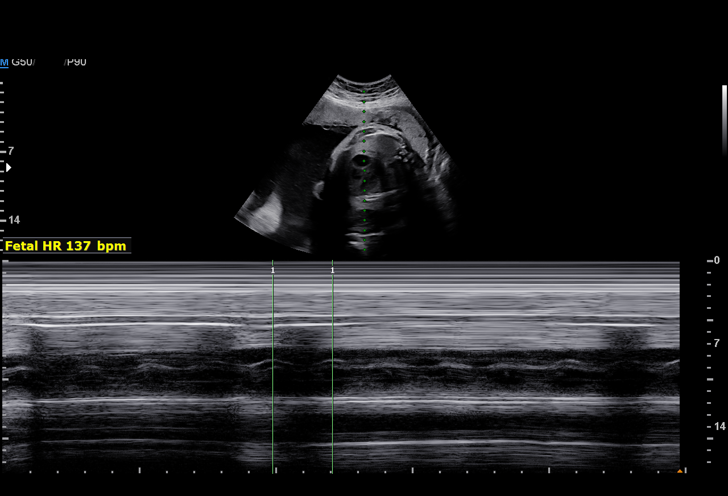
[im 7/36]
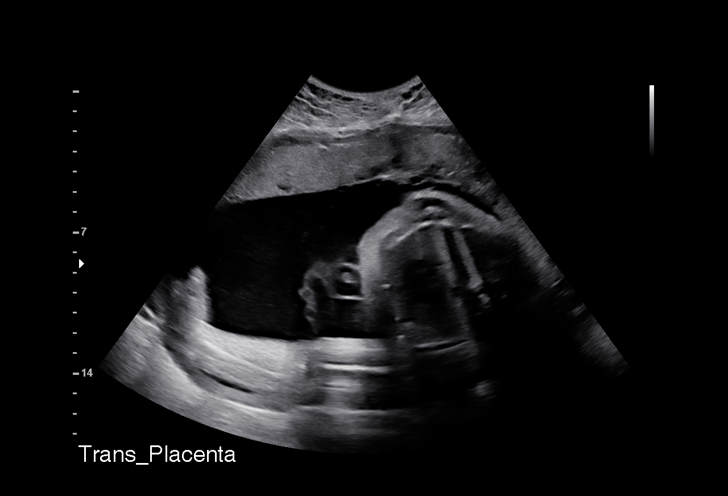
[im 10/36]
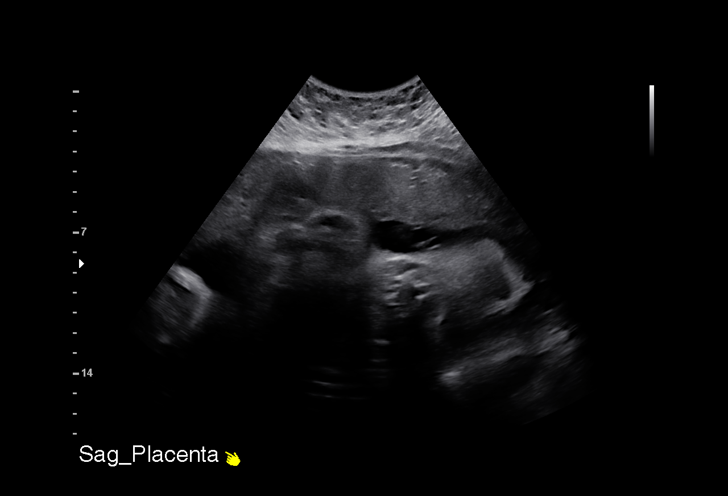
[im 12/36]
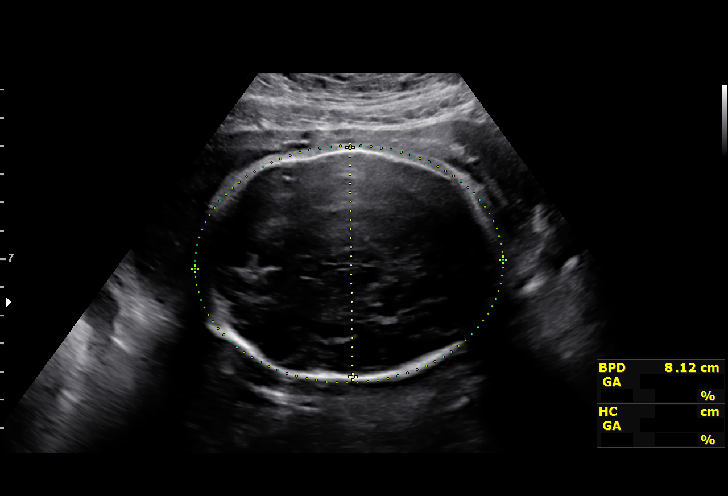
[im 15/36]
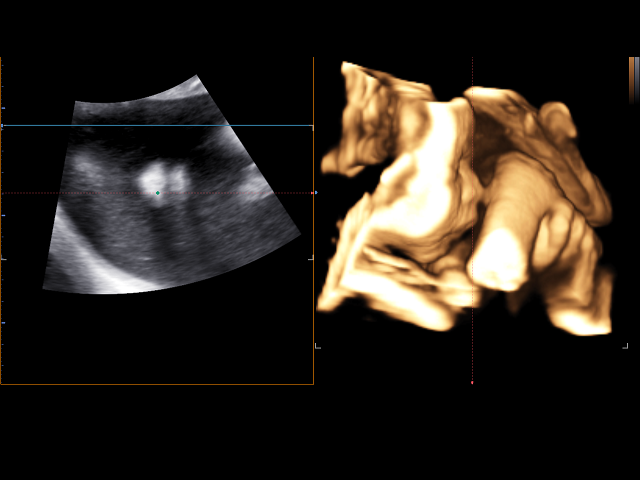
[im 19/36]
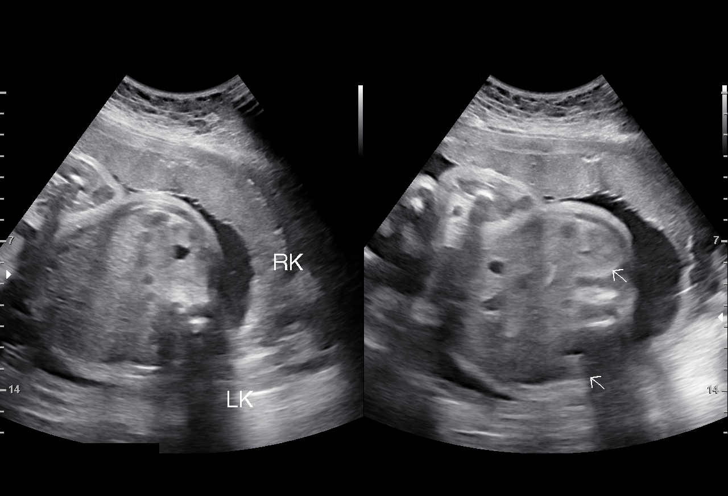
[im 21/36]
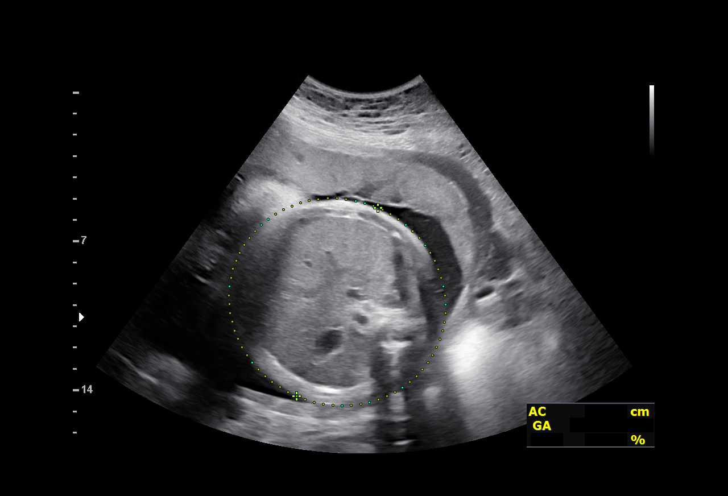
[im 24/36]
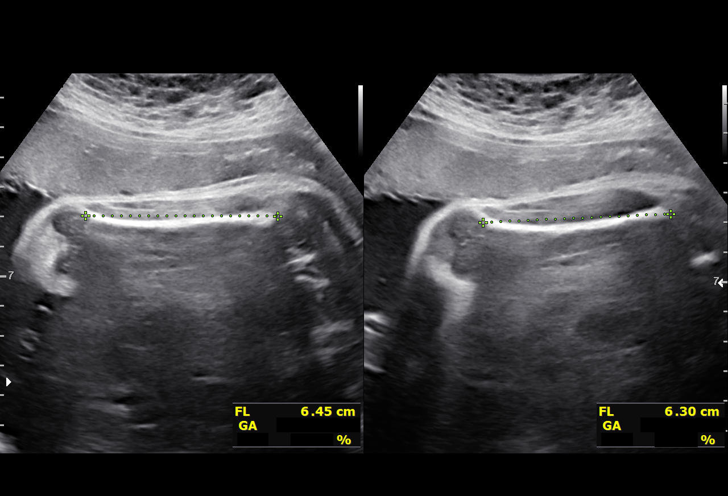
[im 26/36]
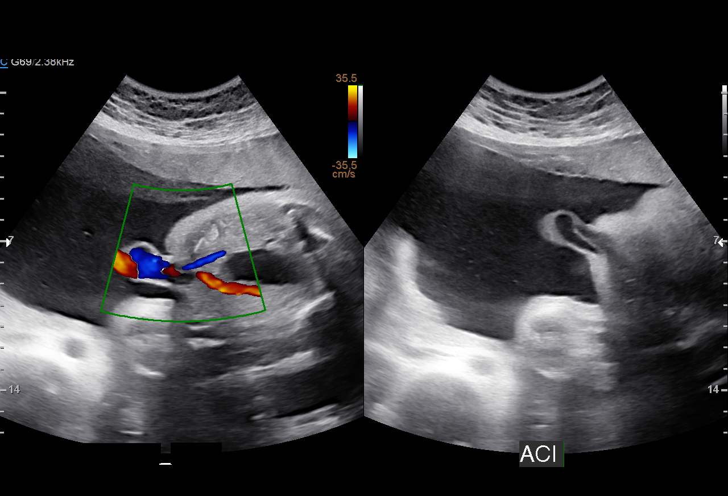
[im 29/36]
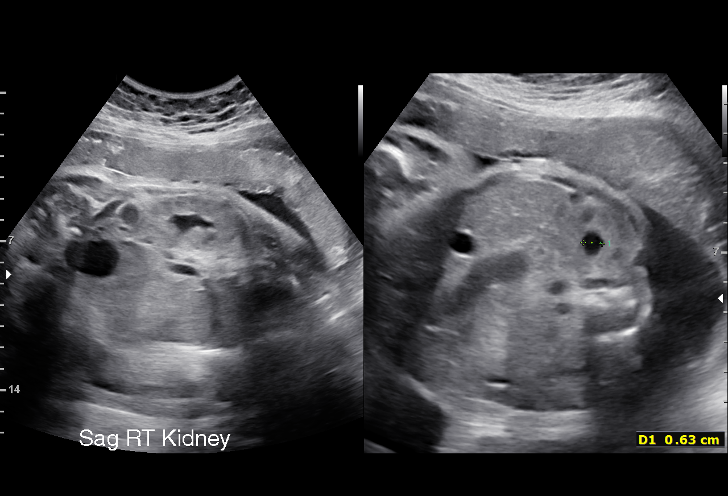
[im 32/36]
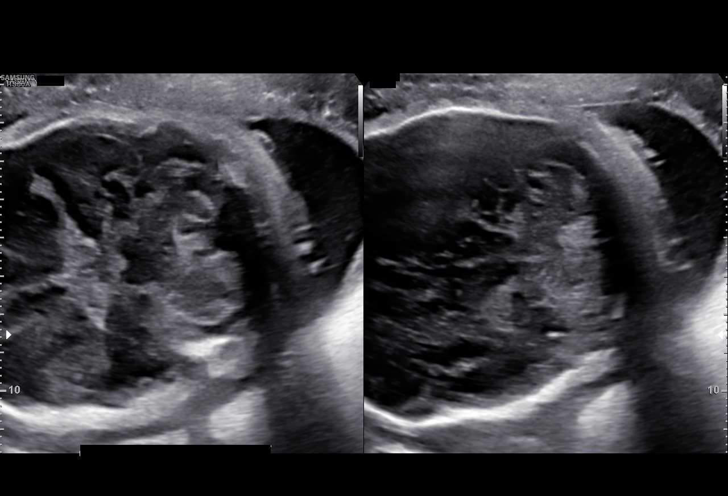
[im 34/36]
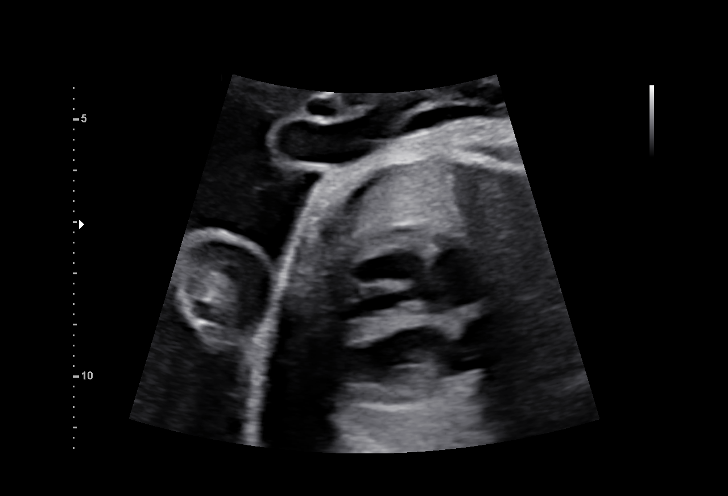

[13 of 28 positions shown; findings below may reference images not displayed]

----------------------------------------------------------------------

 ----------------------------------------------------------------------
Indications

  Encounter for other antenatal screening
  follow-up
  Maternal care for known or suspected poor
  fetal growth, second trimester, not applicable
  or unspecified IUGR
  33 weeks gestation of pregnancy
 ----------------------------------------------------------------------
Vital Signs

                                                Height:        5'5"
Fetal Evaluation

 Num Of Fetuses:         1
 Fetal Heart Rate(bpm):  136
 Cardiac Activity:       Observed
 Presentation:           Cephalic
 Placenta:               Anterior
 P. Cord Insertion:      Previously Visualized

 Amniotic Fluid
 AFI FV:      Within normal limits

 AFI Sum(cm)     %Tile       Largest Pocket(cm)
 21.3            81

 RUQ(cm)       RLQ(cm)       LUQ(cm)        LLQ(cm)

Biometry
 BPD:      81.1  mm     G. Age:  32w 4d         31  %    CI:        70.71   %    70 - 86
                                                         FL/HC:      19.8   %    19.9 -
 HC:      307.4  mm     G. Age:  34w 2d         46  %    HC/AC:      0.99        0.96 -
 AC:       310   mm     G. Age:  35w 0d         94  %    FL/BPD:     75.2   %    71 - 87
 FL:         61  mm     G. Age:  31w 5d         10  %    FL/AC:      19.7   %    20 - 24

 Est. FW:    3355  gm           5 lb     65  %
OB History

 Gravidity:    1         Term:   0        Prem:   0        SAB:   0
 TOP:          0       Ectopic:  0        Living: 0
Gestational Age

 LMP:           34w 2d        Date:  06/24/18                 EDD:   03/31/19
 U/S Today:     33w 3d                                        EDD:   04/06/19
 Best:          33w 0d     Det. By:  U/S  (11/05/18)          EDD:   04/09/19
Anatomy

 Cranium:               Appears normal         LVOT:                   Previously seen
 Cavum:                 Appears normal         Aortic Arch:            Previously seen
 Ventricles:            Appears normal         Ductal Arch:            Previously seen
 Choroid Plexus:        Previously seen        Diaphragm:              Previously seen
 Cerebellum:            Appears normal         Stomach:                Appears normal, left
                                                                       sided
 Posterior Fossa:       Appears normal         Abdomen:                Appears normal
 Nuchal Fold:           Previously seen        Abdominal Wall:         Appears nml (cord
                                                                       insert, abd wall)
 Face:                  Orbits and profile     Cord Vessels:           Appears normal (3
                        previously seen                                vessel cord)
 Lips:                  Previously seen        Kidneys:                Appear normal
 Palate:                Previously seen        Bladder:                Appears normal
 Thoracic:              Appears normal         Spine:                  Previously seen
 Heart:                 Previously seen        Upper Extremities:      Previously seen
 RVOT:                  Previously seen        Lower Extremities:      Previously seen

 Other:  Fetus appears to be a male.
Cervix Uterus Adnexa

 Cervix
 Not visualized (advanced GA >55wks)

 Uterus
 No abnormality visualized.
Impression

 Patient returns for fetal growth assessment.  Initially, her
 gestational age was established by LMP date.  Later, the
 patient told that her LMP date was inaccurate.  We had
 assigned her EDD based on the first ultrasound performed at
 our center.  Subsequently, fetal growth have been
 appropriate for gestational age.
 Her blood pressures have been normal at prenatal visits.
 She does not have gestational diabetes.

 Amniotic fluid is normal and good fetal activity is seen. Fetal
 growth is appropriate for gestational age.
 We reassured the patient of the findings.
Recommendations

 Follow-up scans as clinically indicated.
                 Corcos, Vaniok

## 2021-05-04 ENCOUNTER — Emergency Department (HOSPITAL_BASED_OUTPATIENT_CLINIC_OR_DEPARTMENT_OTHER)
Admission: EM | Admit: 2021-05-04 | Discharge: 2021-05-04 | Disposition: A | Discharge status: Home / Self Care | Payer: Medicaid Other | Attending: Emergency Medicine | Admitting: Emergency Medicine

## 2021-05-04 ENCOUNTER — Other Ambulatory Visit: Payer: Self-pay

## 2021-05-04 ENCOUNTER — Emergency Department (HOSPITAL_BASED_OUTPATIENT_CLINIC_OR_DEPARTMENT_OTHER): Payer: Medicaid Other | Admitting: Radiology

## 2021-05-04 ENCOUNTER — Encounter (HOSPITAL_BASED_OUTPATIENT_CLINIC_OR_DEPARTMENT_OTHER): Payer: Self-pay | Admitting: Emergency Medicine

## 2021-05-04 DIAGNOSIS — J069 Acute upper respiratory infection, unspecified: Secondary | ICD-10-CM

## 2021-05-04 DIAGNOSIS — J3489 Other specified disorders of nose and nasal sinuses: Secondary | ICD-10-CM | POA: Insufficient documentation

## 2021-05-04 DIAGNOSIS — R0981 Nasal congestion: Secondary | ICD-10-CM | POA: Insufficient documentation

## 2021-05-04 DIAGNOSIS — J029 Acute pharyngitis, unspecified: Secondary | ICD-10-CM | POA: Insufficient documentation

## 2021-05-04 DIAGNOSIS — R059 Cough, unspecified: Secondary | ICD-10-CM | POA: Insufficient documentation

## 2021-05-04 DIAGNOSIS — Z20822 Contact with and (suspected) exposure to covid-19: Secondary | ICD-10-CM | POA: Diagnosis not present

## 2021-05-04 DIAGNOSIS — F17200 Nicotine dependence, unspecified, uncomplicated: Secondary | ICD-10-CM | POA: Diagnosis not present

## 2021-05-04 LAB — RESP PANEL BY RT-PCR (FLU A&B, COVID) ARPGX2
Influenza A by PCR: NEGATIVE
Influenza B by PCR: NEGATIVE
SARS Coronavirus 2 by RT PCR: NEGATIVE

## 2021-05-04 LAB — GROUP A STREP BY PCR: Group A Strep by PCR: NOT DETECTED

## 2021-05-04 MED ORDER — FLUTICASONE PROPIONATE 50 MCG/ACT NA SUSP: 1.0000 | Freq: Every day | NASAL | 0 refills | Status: DC

## 2021-05-04 MED ORDER — ACETAMINOPHEN 325 MG PO TABS: 650.0000 mg | ORAL_TABLET | Freq: Four times a day (QID) | ORAL | 0 refills | Status: DC | PRN

## 2021-05-04 MED ORDER — ALBUTEROL SULFATE HFA 108 (90 BASE) MCG/ACT IN AERS: 1.0000 | INHALATION_SPRAY | Freq: Four times a day (QID) | RESPIRATORY_TRACT | 0 refills | Status: DC | PRN

## 2021-05-04 MED ORDER — GUAIFENESIN 100 MG/5ML PO LIQD: 100.0000 mg | ORAL | 0 refills | Status: DC | PRN

## 2021-05-04 MED ORDER — CEPACOL REGULAR STRENGTH 3 MG MT LOZG: 1.0000 | LOZENGE | OROMUCOSAL | 0 refills | Status: DC | PRN

## 2021-05-04 NOTE — ED Provider Notes (Signed)
?[facility] EMERGENCY DEPT ?Provider Note ? ? ?CSN: [id number] ?Arrival date & time: 05/04/21  1844 ? ?  ? ?History ? ?Chief Complaint  ?Patient presents with  ? Sore Throat  ? Nasal Congestion  ? ? ?Ana Lloyd is a 21 y.o. female. ? ?This is a 21 y.o. female with significant medical history as below, including tobacco use, obesity, acid reflux who presents to the ED with complaint of upper respiratory tract infection symptoms.  Onset 2-3 days ago.  Sore throat, congestion, cough with clear/occasional yellow sputum.  No fevers or chills.  No change to p.o. intake.  No change in bowel or bladder function.  No chest pain.  Positive sick contacts with child with similar symptoms was also evaluated emergency department.  She tried OTC cold/flu remedy which did not seem to improve her symptoms. ? ? ? 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    Comment:  x3  ?No date: Obesity ?No date: Strep throat ?No date: UTI (lower urinary tract infection) ? ?Past Surgical History: ?No date: ADENOIDECTOMY ?04/03/2019: CESAREAN SECTION; N/A ?    Comment:  Procedure: CESAREAN SECTION;  Surgeon: Levie HeritageStinson, Jacob J, ?             DO;  Location: [facility];  Service: Obstetrics;   ?             Laterality: N/A; ?No date: TONSILLECTOMY  ? ? ?The history is provided by the patient. No language interpreter was used.  ?Sore Throat ?Pertinent negatives include no chest pain, no abdominal pain, no headaches and no shortness of breath.  ? ?  ? ?Home Medications ?Prior to Admission medications   ?Medication Sig Start Date End Date Taking? Authorizing Provider  ?ferrous sulfate 325 (65 FE) MG tablet Take 1 tablet (325 mg total) by mouth 2 (two) times daily with a meal. 04/05/19   Cresenzo-Dishmon, Scarlette CalicoFrances, CNM  ?ibuprofen (ADVIL) 800 MG tablet Take 1 tablet (800 mg total) by mouth every 6 (six) hours. 04/05/19    Jacklyn Shellresenzo-Dishmon, Frances, CNM  ?oxyCODONE (OXY IR/ROXICODONE) 5 MG immediate release tablet Take 1-2 tablets (5-10 mg total) by mouth every 4 (four) hours as needed for moderate pain. 04/05/19   Jacklyn Shellresenzo-Dishmon, Frances, CNM  ?Prenatal Vit-Fe Fumarate-FA (PRENATAL MULTIVITAMIN) TABS tablet Take 1 tablet by mouth daily at 12 noon.    [provider]  ?   ? ?Allergies    ?Bactrim [sulfamethoxazole-trimethoprim], Hydrocodone, and Tamiflu [oseltamivir phosphate]   ? ?Review of Systems   ?Review of Systems  ?Constitutional:  Negative for chills and fever.  ?HENT:  Positive for congestion, rhinorrhea, sinus pressure and sore throat. Negative for facial swelling and trouble swallowing.   ?Eyes:  Negative for photophobia and visual disturbance.  ?Respiratory:  Positive for cough. Negative for shortness of breath.   ?Cardiovascular:  Negative for chest pain and palpitations.  ?Gastrointestinal:  Negative for abdominal pain, nausea and vomiting.  ?Endocrine: Negative for polydipsia and polyuria.  ?Genitourinary:  Negative for difficulty urinating and hematuria.  ?Musculoskeletal:  Negative for gait problem and joint swelling.  ?Skin:  Negative for pallor and rash.  ?Neurological:  Negative for syncope and headaches.  ?Psychiatric/Behavioral:  Negative for agitation and confusion.   ? ?Physical Exam ?Updated Vital Signs ?BP 136/86 (BP Location: Right Arm)   Pulse 95   Temp 98.8 ?F (37.1 ?C) (Oral)   Resp 18  Ht 5\' 5"  (1.651 m)   Wt 77.1 kg   LMP 05/03/2021 (Approximate)   SpO2 99%   BMI 28.29 kg/m?  ?Physical Exam ?Vitals and nursing note reviewed.  ?Constitutional:   ?   General: She is not in acute distress. ?   Appearance: Normal appearance. She is well-developed. She is not ill-appearing, toxic-appearing or diaphoretic.  ?HENT:  ?   Head: Normocephalic and atraumatic.  ?   Right Ear: External ear normal.  ?   Left Ear: External ear normal.  ?   Nose: Congestion and rhinorrhea present.  ?   Mouth/Throat:   ?   Mouth: Mucous membranes are moist.  ?Eyes:  ?   General: No scleral icterus.    ?   Right eye: No discharge.     ?   Left eye: No discharge.  ?Cardiovascular:  ?   Rate and Rhythm: Normal rate and regular rhythm.  ?   Pulses: Normal pulses.  ?   Heart sounds: Normal heart sounds.  ?Pulmonary:  ?   Effort: Pulmonary effort is normal. No tachypnea, accessory muscle usage or respiratory distress.  ?   Breath sounds: Normal breath sounds. No stridor. No wheezing or rhonchi.  ?Abdominal:  ?   General: Abdomen is flat.  ?   Palpations: Abdomen is soft.  ?   Tenderness: There is no abdominal tenderness.  ?Musculoskeletal:     ?   General: Normal range of motion.  ?   Cervical back: Full passive range of motion without pain, normal range of motion and neck supple.  ?   Right lower leg: No edema.  ?   Left lower leg: No edema.  ?Skin: ?   General: Skin is warm and dry.  ?   Capillary Refill: Capillary refill takes less than 2 seconds.  ?Neurological:  ?   Mental Status: She is alert and oriented to person, place, and time.  ?   GCS: GCS eye subscore is 4. GCS verbal subscore is 5. GCS motor subscore is 6.  ?Psychiatric:     ?   Mood and Affect: Mood normal.     ?   Behavior: Behavior normal.  ? ? ?ED Results / Procedures / Treatments   ?Labs ?(all labs ordered are listed, but only abnormal results are displayed) ?Labs Reviewed  ?GROUP A STREP BY PCR  ?RESP PANEL BY RT-PCR (FLU A&B, COVID) [id number]  ? ? ?EKG ?None ? ?Radiology ?DG Chest 1 View ? ?Result Date: 05/04/2021 ?CLINICAL DATA:  Cough. EXAM: CHEST  1 VIEW COMPARISON:  None. FINDINGS: The heart size and mediastinal contours are within normal limits. Both lungs are clear. The visualized skeletal structures are unremarkable. IMPRESSION: No active disease. Electronically Signed   By: 05/06/2021 M.D.   On: 05/04/2021 21:19   ? ?Procedures ?Procedures  ? ? ?Medications Ordered in ED ?Medications - No data to display ? ?ED Course/ Medical Decision Making/ A&P ?  ?                         ?Medical Decision Making ?Amount and/or Complexity of Data Reviewed ?Radiology: ordered. ? ? ?Initial Impression and Ddx ?This patient presents to the Emergency Department for the above complaint. This involves an extensive number of treatment options and is a complaint that carries with it a high risk of complications and morbidity. Vital signs were reviewed.  ? ?Serious etiologies considered. Ddx includes but is not limited to:  Viral, bacterial, COVID, flu, strep ? ?Patient PMH that increases complexity of ED encounter: Smoking ? ?Social determinants of health include smoking ? ?Additional history obtained from none ? ?Previous records obtained and reviewed  ? ?Interpretation of Diagnostics ?Labs & imaging results that were available during my care of the patient were visualized by me and considered in my medical decision making. ?  ?I ordered imaging studies which included chest x-ray and I visualized the imaging and I agree with radiologist interpretation.  No acute process ? ?PDMP reviewed  ? ? ?Patient Reassessment and Ultimate Disposition/Management ? ? ?  ? ? ?Viral swabs negative.  Chest x-ray negative. ? ?I have low suspicion for bacterial pneumonia.  No evidence of deep space infection on pharynx. ? ?She is overall well-appearing, tolerant p.o. intake.  Breathing comfortably on ambient air.  No respiratory distress.  Afebrile.  Not septic. ? ?Favor viral infection. ? ?Discussed supportive care at home.  Strict return precautions were discussed. ? ?Advised patient to stop smoking ? ?The patient improved significantly and was discharged in stable condition. Detailed discussions were had with the patient regarding current findings, and need for close f/u with PCP or on call doctor. The patient has been instructed to return immediately if the symptoms worsen in any way for re-evaluation. Patient verbalized understanding and is in agreement with current care plan. All questions answered  prior to discharge. ? ? ? ? ? ? ? ? ? ? ? ? ? ? ? ?Counseled patient for approximately 3 minutes regarding smoking cessation. Discussed risks of smoking and how they applied and affected their visit here today

## 2021-05-04 NOTE — ED Triage Notes (Signed)
Pt via pov from home with nasal congestion and sore throat that started yesterday. Son has had similar symptoms x 3 weeks. Pt alert & oriented, nad noted. ?

## 2021-05-04 NOTE — Discharge Instructions (Signed)
It was a pleasure caring for you today in the emergency department. ° °Please return to the emergency department for any worsening or worrisome symptoms. ° ° °

## 2021-05-05 ENCOUNTER — Telehealth: Payer: Self-pay

## 2021-05-05 NOTE — Telephone Encounter (Signed)
Transition Care Management Follow-up Telephone Call ?Date of discharge and from where: 05/04/2021 from [facility] ?How have you been since you were released from the hospital? Patient stated that she is feeling a little bit better. Patient will pick up rx'ed meds today.  ?Any questions or concerns? No ? ?Items Reviewed: ?Did the pt receive and understand the discharge instructions provided? Yes  ?Medications obtained and verified? Yes  ?Other? No  ?Any new allergies since your discharge? No  ?Dietary orders reviewed? No ?Do you have support at home? Yes  ? ?Functional Questionnaire: (I = Independent and D = Dependent) ?ADLs: I ? ?Bathing/Dressing- I ? ?Meal Prep- I ? ?Eating- I ? ?Maintaining continence- I ? ?Transferring/Ambulation- I ? ?Managing Meds- I ? ? ?Follow up appointments reviewed: ? ?PCP Hospital f/u appt confirmed? No  Patient is est with [facility] ?[facility] f/u appt confirmed? No   ?Are transportation arrangements needed? No  ?If their condition worsens, is the pt aware to call PCP or go to the Emergency Dept.? Yes ?Was the patient provided with contact information for the PCP's office or ED? Yes ?Was to pt encouraged to call back with questions or concerns? Yes ? ?

## 2021-08-27 ENCOUNTER — Ambulatory Visit: Payer: Medicaid Other

## 2022-02-01 ENCOUNTER — Emergency Department (HOSPITAL_BASED_OUTPATIENT_CLINIC_OR_DEPARTMENT_OTHER)
Admission: EM | Admit: 2022-02-01 | Discharge: 2022-02-01 | Disposition: A | Discharge status: Home / Self Care | Payer: Medicaid Other | Attending: Emergency Medicine | Admitting: Emergency Medicine

## 2022-02-01 ENCOUNTER — Emergency Department (HOSPITAL_BASED_OUTPATIENT_CLINIC_OR_DEPARTMENT_OTHER): Payer: Medicaid Other

## 2022-02-01 ENCOUNTER — Encounter (HOSPITAL_BASED_OUTPATIENT_CLINIC_OR_DEPARTMENT_OTHER): Payer: Self-pay | Admitting: Emergency Medicine

## 2022-02-01 DIAGNOSIS — R1011 Right upper quadrant pain: Secondary | ICD-10-CM | POA: Diagnosis not present

## 2022-02-01 DIAGNOSIS — N3001 Acute cystitis with hematuria: Secondary | ICD-10-CM

## 2022-02-01 DIAGNOSIS — N3000 Acute cystitis without hematuria: Secondary | ICD-10-CM | POA: Insufficient documentation

## 2022-02-01 LAB — CBC
HCT: 30.4 % — ABNORMAL LOW (ref 36.0–46.0)
Hemoglobin: 10.3 g/dL — ABNORMAL LOW (ref 12.0–15.0)
MCH: 31.5 pg (ref 26.0–34.0)
MCHC: 33.9 g/dL (ref 30.0–36.0)
MCV: 93 fL (ref 80.0–100.0)
Platelets: 290 10*3/uL (ref 150–400)
RBC: 3.27 MIL/uL — ABNORMAL LOW (ref 3.87–5.11)
RDW: 12.8 % (ref 11.5–15.5)
WBC: 14.7 10*3/uL — ABNORMAL HIGH (ref 4.0–10.5)
nRBC: 0 % (ref 0.0–0.2)

## 2022-02-01 LAB — COMPREHENSIVE METABOLIC PANEL
ALT: 6 U/L (ref 0–44)
AST: 9 U/L — ABNORMAL LOW (ref 15–41)
Albumin: 4.1 g/dL (ref 3.5–5.0)
Alkaline Phosphatase: 48 U/L (ref 38–126)
Anion gap: 6 (ref 5–15)
BUN: 10 mg/dL (ref 6–20)
CO2: 26 mmol/L (ref 22–32)
Calcium: 8.7 mg/dL — ABNORMAL LOW (ref 8.9–10.3)
Chloride: 107 mmol/L (ref 98–111)
Creatinine, Ser: 0.59 mg/dL (ref 0.44–1.00)
GFR, Estimated: >60 mL/min (ref >60)
Glucose, Bld: 99 mg/dL (ref 70–99)
Potassium: 4.2 mmol/L (ref 3.5–5.1)
Sodium: 139 mmol/L (ref 135–145)
Total Bilirubin: 0.4 mg/dL (ref 0.3–1.2)
Total Protein: 6.7 g/dL (ref 6.5–8.1)

## 2022-02-01 LAB — URINALYSIS, ROUTINE W REFLEX MICROSCOPIC
Bilirubin Urine: NEGATIVE
Glucose, UA: NEGATIVE mg/dL
Hgb urine dipstick: NEGATIVE
Ketones, ur: NEGATIVE mg/dL
Nitrite: POSITIVE — AB
Specific Gravity, Urine: 1.024 (ref 1.005–1.030)
pH: 7.5 (ref 5.0–8.0)

## 2022-02-01 LAB — PREGNANCY, URINE: Preg Test, Ur: NEGATIVE

## 2022-02-01 LAB — LIPASE, BLOOD: Lipase: 31 U/L (ref 11–51)

## 2022-02-01 MED ORDER — CEPHALEXIN 500 MG PO CAPS: 500.0000 mg | ORAL_CAPSULE | Freq: Two times a day (BID) | ORAL | 0 refills | Status: AC

## 2022-02-01 NOTE — ED Triage Notes (Signed)
Mid abd pressure since yesterday. States she passed out yesterday due to pain. Denies urinary symptoms, vaginal discharge.

## 2022-02-01 NOTE — Discharge Instructions (Addendum)
You were seen in the ED today and diagnosed with a UTI. Based on labs and imaging, this appears to be an uncomplicated UTI that will be managed with a 5 day course of antibiotics. If your symptoms do not improve with this course of antibiotics or you are concerned about any new symptoms that appear, please visit your OB for further evaluation or return to the ED for more testing and treatment.

## 2022-02-01 NOTE — ED Provider Notes (Signed)
[facility] EM[facility] DEPT Provider Note   CSN: [id number] Arrival date & time: 02/01/22  1106     History Chief Complaint  Patient presents with   Abdominal Pain    Ana Lloyd is a 21 y.o. female.   Abdominal Pain Associated symptoms: no dysuria, no hematuria, no vaginal bleeding and no vaginal discharge   Patient presenting emergency department with several days of abdominal pain.  She describes pain as generalized with some localization to right upper quadrant and epigastric regions.  She also reports difficulties with complete voiding of urine.  Denies any dysuria, dyspareunia, hematuria or vaginal discharge/odor.  No fevers at home.  Loss of consciousness in the shower yesterday due to severity of pain.  She reports being tested for STIs about 2 to 3 weeks ago with all testing being negative.  Only has 1 female sexual partner at this time.  Reports that she has regular pain tolerance where she passed out due to this difficulty tolerating pain.     Home Medications Prior to Admission medications   Medication Sig Start Date End Date Taking? Authorizing Provider  cephALEXin (KEFLEX) 500 MG capsule Take 1 capsule (500 mg total) by mouth 2 (two) times daily for 5 days. 02/01/22 02/06/22 Yes Smitty Knudsen, PA-C  acetaminophen (TYLENOL) 325 MG tablet Take 2 tablets (650 mg total) by mouth every 6 (six) hours as needed. 05/04/21   Sloan Leiter, DO  albuterol (VENTOLIN HFA) 108 (90 Base) MCG/ACT inhaler Inhale 1-2 puffs into the lungs every 6 (six) hours as needed for wheezing or shortness of breath. 05/04/21   Tanda Rockers A, DO  ferrous sulfate 325 (65 FE) MG tablet Take 1 tablet (325 mg total) by mouth 2 (two) times daily with a meal. 04/05/19   Cresenzo-Dishmon, Scarlette Calico, CNM  fluticasone (FLONASE) 50 MCG/ACT nasal spray Place 1 spray into both nostrils daily for 7 days. 05/04/21 05/11/21  Sloan Leiter, DO  guaiFENesin (ROBITUSSIN) 100 MG/5ML liquid Take 5-10 mLs  (100-200 mg total) by mouth every 4 (four) hours as needed for cough or to loosen phlegm. 05/04/21   Sloan Leiter, DO  ibuprofen (ADVIL) 800 MG tablet Take 1 tablet (800 mg total) by mouth every 6 (six) hours. 04/05/19   Cresenzo-Dishmon, Scarlette Calico, CNM  menthol-cetylpyridinium (CEPACOL REGULAR STRENGTH) 3 MG lozenge Take 1 lozenge (3 mg total) by mouth as needed for sore throat. 05/04/21   Tanda Rockers A, DO  oxyCODONE (OXY IR/ROXICODONE) 5 MG immediate release tablet Take 1-2 tablets (5-10 mg total) by mouth every 4 (four) hours as needed for moderate pain. 04/05/19   Cresenzo-Dishmon, Scarlette Calico, CNM  Prenatal Vit-Fe Fumarate-FA (PRENATAL MULTIVITAMIN) TABS tablet Take 1 tablet by mouth daily at 12 noon.    [provider]      Allergies    Bactrim [sulfamethoxazole-trimethoprim], Hydrocodone, and Tamiflu [oseltamivir phosphate]    Review of Systems   Review of Systems  Gastrointestinal:  Positive for abdominal pain.  Genitourinary:  Positive for difficulty urinating and frequency. Negative for dyspareunia, dysuria, hematuria, urgency, vaginal bleeding, vaginal discharge and vaginal pain.  All other systems reviewed and are negative.   Physical Exam Updated Vital Signs BP 105/72   Pulse 96   Temp 98.2 F (36.8 C)   Resp 17   Ht 5\' 4"  (1.626 m)   Wt 79.4 kg   LMP 12/30/2021   SpO2 100%   BMI 30.04 kg/m  Physical Exam Vitals and nursing note reviewed.  Constitutional:  Appearance: She is well-developed.  HENT:     Head: Normocephalic and atraumatic.  Cardiovascular:     Rate and Rhythm: Normal rate and regular rhythm.  Pulmonary:     Effort: Pulmonary effort is normal. No respiratory distress.     Breath sounds: Normal breath sounds. No wheezing.  Abdominal:     General: Abdomen is flat. Bowel sounds are normal.     Palpations: Abdomen is soft.     Tenderness: There is generalized abdominal tenderness and tenderness in the right upper quadrant. There is no guarding  or rebound.  Skin:    General: Skin is warm.     Capillary Refill: Capillary refill takes less than 2 seconds.  Neurological:     General: No focal deficit present.     Mental Status: She is alert.  Psychiatric:        Mood and Affect: Mood normal.     ED Results / Procedures / Treatments   Labs (all labs ordered are listed, but only abnormal results are displayed) Labs Reviewed  COMPREHENSIVE METABOLIC PANEL - Abnormal; Notable for the following components:      Result Value   Calcium 8.7 (*)    AST 9 (*)    All other components within normal limits  CBC - Abnormal; Notable for the following components:   WBC 14.7 (*)    RBC 3.27 (*)    Hemoglobin 10.3 (*)    HCT 30.4 (*)    All other components within normal limits  URINALYSIS, ROUTINE W REFLEX MICROSCOPIC - Abnormal; Notable for the following components:   APPearance CLOUDY (*)    Protein, ur TRACE (*)    Nitrite POSITIVE (*)    Leukocytes,Ua SMALL (*)    Bacteria, UA MANY (*)    All other components within normal limits  LIPASE, BLOOD  PREGNANCY, URINE    EKG None  Radiology US Abdomen Limited RUQ (LIVER/GB)  Result Date: 02/01/2022 CLINICAL DATA:  Right upper quadrant pain for 2 days EXAM: ULTRASOUND ABDOMEN LIMITED RIGHT UPPER QUADRANT COMPARISON:  None Available. FINDINGS: Gallbladder: No gallstones or wall thickening visualized. No sonographic Murphy sign noted by sonographer. Common bile duct: Diameter: 4.1 mm Liver: No focal lesion identified. Within normal limits in parenchymal echogenicity. Portal vein is patent on color Doppler imaging with normal direction of blood flow towards the liver. Other: There is free fluid in the right upper quadrant including in Morison's pouch. An underlying cause for the fluid is not identified. IMPRESSION: 1. There is free fluid in the right upper quadrant including in Morison's pouch. An underlying cause for the fluid is not identified. 2. The gallbladder, common bile duct, and  liver are unremarkable. Electronically Signed   By: Gerome Sam III M.D.   On: 02/01/2022 16:16    Procedures Procedures   Medications Ordered in ED Medications - No data to display  ED Course/ Medical Decision Making/ A&P                           Medical Decision Making Amount and/or Complexity of Data Reviewed Radiology: ordered.   This patient presents to the ED for concern of abdominal pain.  Differential diagnosis includes urinary tract infection, cholecystitis, hepatitis, bowel obstruction, gastroenteritis   Lab Tests:  I Ordered, and personally interpreted labs.  The pertinent results include: UA consistent with urinary tract infection showing nitrates, leukocytes, bacteria   Imaging Studies ordered:  I ordered imaging studies  including RUQ ultrasound I independently visualized and interpreted imaging which showed gallbladder, liver, common bile duct unremarkable.  Some free fluid noted in Morison's pouch without known cause. I agree with the radiologist interpretation   Medicines ordered and prescription drug management:  I have reviewed the patients home medicines and have made adjustments as needed   Problem List / ED Course:  Patient presented emergency department complaints of generalized abdominal pain.  Based on urinalysis performed here in the emergency department patient appears to be positive for urinary tract infection.  When asking patient about her symptoms she did report to me that she has been having the sensation of incomplete voiding with some pressure in her lower abdomen.  She is also complaining of right upper quadrant tenderness for which we performed a ultrasound which is reassuring and no acute findings seen with the exception of some free fluid in Morison's pouch of unknown etiology.  Given the patient did not have any indication for a traumatic appearance or any concerning vitals during course of ED visit we will consider this to be a urinary  tract infection that can be treated in the outpatient setting with no need for inpatient admission.  I did advise patient that she does not achieve any relief in symptoms with the use of the antibiotic that she should follow-up with her OB for further assessment as well as for evaluation as to the leak.  Even though urine pregnancy testing was negative here.  Patient verbalized understanding treatment plan and all necessary return precautions.  No further questions at end of assessment.   Final Clinical Impression(s) / ED Diagnoses Final diagnoses:  Acute cystitis with hematuria    Rx / DC Orders ED Discharge Orders          Ordered    cephALEXin (KEFLEX) 500 MG capsule  2 times daily        02/01/22 1636              Smitty Knudsen, PA-C 02/01/22 1644    Virgina Norfolk, DO 02/01/22 2331

## 2022-09-14 ENCOUNTER — Ambulatory Visit (INDEPENDENT_AMBULATORY_CARE_PROVIDER_SITE_OTHER): Payer: Medicaid Other

## 2022-09-14 ENCOUNTER — Ambulatory Visit
Admission: EM | Admit: 2022-09-14 | Discharge: 2022-09-14 | Disposition: A | Discharge status: Home / Self Care | Payer: Medicaid Other | Attending: Physician Assistant | Admitting: Physician Assistant

## 2022-09-14 DIAGNOSIS — S6992XA Unspecified injury of left wrist, hand and finger(s), initial encounter: Secondary | ICD-10-CM

## 2022-09-14 DIAGNOSIS — M25532 Pain in left wrist: Secondary | ICD-10-CM | POA: Diagnosis not present

## 2022-09-14 NOTE — ED Triage Notes (Signed)
Pt states she fell off the bed last night while playing with her child and spouse and tried to catch herself with left hand and now having pain and hard to move left wrist. Taking tylenol.

## 2022-09-14 NOTE — ED Provider Notes (Signed)
[facility]    CSN: [id number] Arrival date & time: 09/14/22  1541      History   Chief Complaint Chief Complaint  Patient presents with   Wrist Pain    HPI Ana Lloyd is a 22 y.o. female.   Patient here today for evaluation of left wrist pain.  She states that she was playing on the bed with her boyfriend and 65-year-old last night and accidentally fell landing on her wrist and hand.  She notes pain with movement.  She has not any numbness or tingling.  She has taken Tylenol without resolution of symptoms.  He does have history of prior wrist fracture.  The history is provided by the patient.    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   Insomnia 11/20/2015   Kidney infection    Kidney stone    x3    Obesity    Strep throat    UTI (lower urinary tract infection)     Patient Active Problem List   Diagnosis Date Noted   Onset (spontaneous) of labor after 37 completed weeks of gestation but before 39 completed weeks gestation, with delivery by (planned) cesarean section 04/03/2019   Status post cesarean section 04/03/2019   Placental abruption 04/03/2019   Supervision of normal first pregnancy, antepartum 09/04/2018   Morning sickness 09/03/2018   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  Authorizing Provider  acetaminophen (TYLENOL) 325 MG tablet Take 2 tablets (650 mg total) by mouth every 6 (six) hours as needed. 05/04/21  Yes Tanda Rockers A, DO  albuterol (VENTOLIN HFA) 108 (90 Base) MCG/ACT inhaler Inhale 1-2 puffs into the lungs every 6 (six) hours as needed for wheezing or shortness of breath. 05/04/21   Tanda Rockers A, DO  ferrous sulfate 325 (65 FE) MG tablet Take 1 tablet (325 mg total) by mouth 2 (two) times daily with a meal. 04/05/19   Cresenzo-Dishmon, Scarlette Calico, CNM  fluticasone (FLONASE) 50 MCG/ACT nasal spray Place 1 spray into both nostrils daily for 7 days. 05/04/21 05/11/21  Sloan Leiter, DO  guaiFENesin (ROBITUSSIN) 100 MG/5ML liquid Take 5-10 mLs (100-200 mg total) by mouth every 4 (four) hours as needed for cough or to loosen phlegm. 05/04/21   Sloan Leiter, DO  ibuprofen (ADVIL) 800 MG tablet Take 1 tablet (800 mg total) by mouth every 6 (six) hours. 04/05/19   Cresenzo-Dishmon, Scarlette Calico, CNM  menthol-cetylpyridinium (CEPACOL REGULAR STRENGTH) 3 MG lozenge Take 1 lozenge (3 mg total) by mouth as needed for sore throat. 05/04/21  Tanda Rockers A, DO  oxyCODONE (OXY IR/ROXICODONE) 5 MG immediate release tablet Take 1-2 tablets (5-10 mg total) by mouth every 4 (four) hours as needed for moderate pain. 04/05/19   Cresenzo-Dishmon, Scarlette Calico, CNM  Prenatal Vit-Fe Fumarate-FA (PRENATAL MULTIVITAMIN) TABS tablet Take 1 tablet by mouth daily at 12 noon.    [provider]    Family History Family History  Problem Relation Age of Onset   Diabetes Other    Hypertension Other     Social History Social History   Tobacco Use   Smoking status: Former    Current packs/day: 0.50    Types: Cigarettes   Smokeless tobacco: Never   Tobacco comments:    none since +UPT  Vaping Use   Vaping status: Never Used  Substance Use Topics   Alcohol use: Not Currently    Comment: occasional   Drug use: Not Currently    Types: Marijuana    Comment: last was 08/15/18      Allergies   Bactrim [sulfamethoxazole-trimethoprim]   Review of Systems Review of Systems  Constitutional:  Negative for chills and fever.  Eyes:  Negative for discharge and redness.  Musculoskeletal:  Positive for arthralgias. Negative for joint swelling.  Skin:  Negative for color change and wound.  Neurological:  Negative for numbness.     Physical Exam Triage Vital Signs ED Triage Vitals  Encounter Vitals Group     BP      Systolic BP Percentile      Diastolic BP Percentile      Pulse      Resp      Temp      Temp src      SpO2      Weight      Height      Head Circumference      Peak Flow      Pain Score      Pain Loc      Pain Education      Exclude from Growth Chart    No data found.  Updated Vital Signs BP 115/73 (BP Location: Right Arm)   Pulse 71   Temp 98.1 F (36.7 C) (Oral)   Resp 17   LMP 08/27/2022 (Exact Date)   SpO2 97%      Physical Exam Vitals and nursing note reviewed.  Constitutional:      General: She is not in acute distress.    Appearance: Normal appearance. She is not ill-appearing.  HENT:     Head: Normocephalic and atraumatic.  Eyes:     Conjunctiva/sclera: Conjunctivae normal.  Cardiovascular:     Rate and Rhythm: Normal rate.  Pulmonary:     Effort: Pulmonary effort is normal. No respiratory distress.  Musculoskeletal:     Comments: Decreased range of motion of left wrist in all directions.  Tenderness to palpation noted to dorsal wrist diffusely.  Full range of motion of left fingers.  No significant swelling or erythema noted to wrist.  Skin:    Capillary Refill: Normal cap refill to the left fingers Neurological:     Mental Status: She is alert.     Comments: Sensation intact to left fingers distally  Psychiatric:        Mood and Affect: Mood normal.        Behavior: Behavior normal.        Thought Content: Thought content normal.      UC Treatments / Results  Labs (all labs ordered are  listed, but  only abnormal results are displayed) Labs Reviewed - No data to display  EKG   Radiology DG Wrist Complete Left  Result Date: 09/14/2022 CLINICAL DATA:  Left wrist pain after fall last night. EXAM: LEFT WRIST - COMPLETE 3+ VIEW COMPARISON:  None Available. FINDINGS: There is no evidence of fracture or dislocation. There is no evidence of arthropathy or other focal bone abnormality. Soft tissues are unremarkable. IMPRESSION: Negative. Electronically Signed   By: Lupita Raider M.D.   On: 09/14/2022 16:35    Procedures Procedures (including critical care time)  Medications Ordered in UC Medications - No data to display  Initial Impression / Assessment and Plan / UC Course  I have reviewed the triage vital signs and the nursing notes.  Pertinent labs & imaging results that were available during my care of the patient were reviewed by me and considered in my medical decision making (see chart for details).    X-ray without fracture.  Will treat with short course of steroids and recommended follow-up with Ortho if no gradual improvement.  Also advised RICE therapy and wrist brace provided in office.    Final Clinical Impressions(s) / UC Diagnoses   Final diagnoses:  Left wrist injury, initial encounter   Discharge Instructions   None    ED Prescriptions   None    PDMP not reviewed this encounter.   Tomi Bamberger, PA-C 09/14/22 [phone]

## 2023-01-16 ENCOUNTER — Other Ambulatory Visit: Payer: Self-pay

## 2023-01-16 ENCOUNTER — Inpatient Hospital Stay (HOSPITAL_COMMUNITY): Payer: Medicaid Other

## 2023-01-16 ENCOUNTER — Inpatient Hospital Stay (HOSPITAL_COMMUNITY)
Admission: AD | Admit: 2023-01-16 | Discharge: 2023-01-16 | Disposition: A | Discharge status: Home / Self Care | Payer: Medicaid Other | Attending: Obstetrics and Gynecology | Admitting: Obstetrics and Gynecology

## 2023-01-16 ENCOUNTER — Encounter (HOSPITAL_COMMUNITY): Payer: Self-pay | Admitting: Obstetrics and Gynecology

## 2023-01-16 DIAGNOSIS — Z3A01 Less than 8 weeks gestation of pregnancy: Secondary | ICD-10-CM

## 2023-01-16 DIAGNOSIS — O208 Other hemorrhage in early pregnancy: Secondary | ICD-10-CM | POA: Diagnosis not present

## 2023-01-16 DIAGNOSIS — O3481 Maternal care for other abnormalities of pelvic organs, first trimester: Secondary | ICD-10-CM | POA: Diagnosis not present

## 2023-01-16 DIAGNOSIS — O26891 Other specified pregnancy related conditions, first trimester: Secondary | ICD-10-CM | POA: Diagnosis not present

## 2023-01-16 LAB — URINALYSIS, ROUTINE W REFLEX MICROSCOPIC
Bilirubin Urine: NEGATIVE
Glucose, UA: NEGATIVE mg/dL
Hgb urine dipstick: NEGATIVE
Ketones, ur: NEGATIVE mg/dL
Leukocytes,Ua: NEGATIVE
Nitrite: NEGATIVE
Protein, ur: NEGATIVE mg/dL
Specific Gravity, Urine: 1.015 (ref 1.005–1.030)
pH: 8 (ref 5.0–8.0)

## 2023-01-16 LAB — WET PREP, GENITAL
Clue Cells Wet Prep HPF POC: NONE SEEN
Sperm: NONE SEEN
Trich, Wet Prep: NONE SEEN
WBC, Wet Prep HPF POC: >=10 — AB (ref <10)
Yeast Wet Prep HPF POC: NONE SEEN

## 2023-01-16 LAB — HCG, QUANTITATIVE, PREGNANCY: hCG, Beta Chain, Quant, S: 63167 m[IU]/mL — ABNORMAL HIGH (ref <5)

## 2023-01-16 LAB — POCT PREGNANCY, URINE: Preg Test, Ur: POSITIVE — AB

## 2023-01-16 LAB — CBC
HCT: 38.6 % (ref 36.0–46.0)
Hemoglobin: 13.4 g/dL (ref 12.0–15.0)
MCH: 31.5 pg (ref 26.0–34.0)
MCHC: 34.7 g/dL (ref 30.0–36.0)
MCV: 90.8 fL (ref 80.0–100.0)
Platelets: 328 10*3/uL (ref 150–400)
RBC: 4.25 MIL/uL (ref 3.87–5.11)
RDW: 12.8 % (ref 11.5–15.5)
WBC: 13.9 10*3/uL — ABNORMAL HIGH (ref 4.0–10.5)
nRBC: 0 % (ref 0.0–0.2)

## 2023-01-16 MED ORDER — ONDANSETRON 4 MG PO TBDP: 4.0000 mg | ORAL_TABLET | Freq: Three times a day (TID) | ORAL | 0 refills | Status: DC | PRN

## 2023-01-16 MED ORDER — PREPLUS 27-1 MG PO TABS
1.0000 | ORAL_TABLET | Freq: Every day | ORAL | 13 refills | Status: DC
Start: 2023-01-16 — End: 2023-09-01

## 2023-01-16 NOTE — MAU Provider Note (Cosign Needed Addendum)
Cramping, Lightheaded, missed two periods    Ana Lloyd is a 22 y.o. G2P1001 pregnant female at [redacted]w[redacted]d by LMP who presents to MAU today with complaint of cramping, lightheadedness in Ana/o missing last two periods.  States abdominal pain for last 2 days, similar to period cramps.  Denies VB.  Hasn't taken HPT, LMP 11/24/2022.  Also having intermittent light headedness and dizziness at work. +UPT here in MAU. Upon my evaluation she states the cramping was less than normal menses and is currently asymptomatic Hx of c/Ana. Not on PNV yet.   Pertinent items noted in HPI and remainder of comprehensive ROS otherwise negative.   O BP 109/64 (BP Location: Right Arm)   Pulse 96   Temp 98.4 F (36.9 C) (Oral)   Resp 18   Ht 5\' 5"  (1.651 m)   Wt 69.4 kg   LMP 11/24/2022   SpO2 100%   BMI 25.48 kg/m  Physical Exam Vitals and nursing note reviewed.  Constitutional:      General: She is not in acute distress.    Appearance: She is well-developed and normal weight. She is not ill-appearing.  HENT:     Head: Normocephalic and atraumatic.  Eyes:     Extraocular Movements: Extraocular movements intact.  Cardiovascular:     Rate and Rhythm: Normal rate.  Pulmonary:     Effort: Pulmonary effort is normal. No respiratory distress.  Abdominal:     General: Abdomen is flat. There is no distension.     Palpations: Abdomen is soft.     Tenderness: There is no abdominal tenderness.  Skin:    General: Skin is warm and dry.  Neurological:     Mental Status: She is alert and oriented to person, place, and time.     Motor: No weakness.  Psychiatric:        Mood and Affect: Mood normal.        Behavior: Behavior normal.      MDM: moderate MAU Course:  UPT positive UA noninfectious  Pregnancy of unknown location work up as below:  CBC with chronic leukocytosis lower than baseline, plts 328  bHCG 63167  ABO pending but no VB reported   Wet Prep negative  GC collected   [place] prelim  read intrauterine pregnancy @[redacted]w[redacted]d  with gestational sac with FP and cardiac activity    A&P: #[redacted] weeks gestation - sending PNV - given list of OBGYN providers and OTC meds - given miscarriage precautions given pelvic cramping  Discharge from MAU in stable condition with strict/usual precautions Follow up at desired as scheduled for ongoing prenatal care   Allergies as of 01/16/2023       Reactions   Bactrim [sulfamethoxazole-trimethoprim] Hives, Itching        Medication List     STOP taking these medications    Cepacol Regular Strength 3 MG lozenge Generic drug: menthol-cetylpyridinium   fluticasone 50 MCG/ACT nasal spray Commonly known as: FLONASE   guaiFENesin 100 MG/5ML liquid Commonly known as: ROBITUSSIN   ibuprofen 800 MG tablet Commonly known as: ADVIL   oxyCODONE 5 MG immediate release tablet Commonly known as: Oxy IR/ROXICODONE       TAKE these medications    acetaminophen 325 MG tablet Commonly known as: Tylenol Take 2 tablets (650 mg total) by mouth every 6 (six) hours as needed.   albuterol 108 (90 Base) MCG/ACT inhaler Commonly known as: VENTOLIN HFA Inhale 1-2 puffs into the lungs every 6 (six) hours as  needed for wheezing or shortness of breath.   ferrous sulfate 325 (65 FE) MG tablet Take 1 tablet (325 mg total) by mouth 2 (two) times daily with a meal.   PrePLUS 27-1 MG Tabs Take 1 tablet by mouth daily. What changed:  medication strength when to take this        Hessie Dibble, MD 01/16/2023 7:58 PM

## 2023-01-16 NOTE — Discharge Instructions (Signed)
What are the signs or symptoms of miscarriage? Blood or spots of blood coming from the vagina. You may also have cramps or pain. Pain or cramps in the belly or low back. Fluid or tissue coming out of the vagina. How is this treated? Sometimes, treatment is not needed. If you need treatment, you may be treated with: A procedure to open the cervix more and take tissue out of the womb. Medicines. You may get a shot of medicine called Rho(D) immune globulin. Follow these instructions at home: Medicines Take over-the-counter and prescription medicines only as told by your doctor. If you were prescribed antibiotic medicine, take it as told by your doctor. Do not stop taking it even if you start to feel better. Activity Rest as told by your doctor. Ask your doctor what activities are safe for you. Have someone help you at home during this time. General instructions  Watch how much tissue comes out of the vagina. Watch the size of any blood clots that come out of the vagina. Do not have sex or douche until your doctor says it is okay. Do not put things, such as tampons, in your vagina until your doctor says it is okay. To help you and your partner with grieving: Talk with your doctor. See a Veterinary surgeon. When you are ready, talk with your doctor about: Things to do for your health. How you can be healthy if you get pregnant again. Keep all follow-up visits. Where to find more information [employer] of Obstetricians and Gynecologists: acog.org U.S. Department of Health and Cytogeneticist of Women's Health: http://hoffman.com/ Contact a doctor if: You have a fever or chills. There is bad-smelling fluid coming from the vagina. You have more bleeding. Tissue or clots of blood come out of your vagina. Get help right away if: You have very bad cramps or pain in your back or belly. You soak more than 2 large pads in an hour for more than 2 hours. You get light-headed  or weak. You faint. You feel sad, and you have sad thoughts a lot of the time. You think about hurting yourself. Get help right awayif you feel like you may hurt yourself or others, or have thoughts about taking your own life. Go to your nearest emergency room or: Call your local emergency services (911 in the [place] Call the National Suicide Prevention Lifeline at 4176611103 or 988 in the [place] This is open 24 hours a day. Text the Crisis Text Line at 413-869-3983. Summary A miscarriage is the loss of a pregnancy before the 20th week of pregnancy. Sometimes, a pregnancy ends before a woman knows that she is pregnant. Follow instructions from your doctor about medicines and activity. To help you and your partner with grieving, talk with your doctor or a counselor. Keep all follow-up visits. This information is not intended to replace advice given to you by your health care provider. Make sure you discuss any questions you have with your health care provider. Document Revised: 08/19/2020 Document Reviewed: 07/26/2019 Elsevier Patient Education  2024 Elsevier Inc.   [facility] for [employer] at [employer] [facility]             [address] [place], [place] [address] [phone]  Center for [employer] at [employer]  [address], [place], [place], [address] [phone]  Center for [facility] at [facility] [address], [place], [place], [address] [phone]  Center for [facility] Healthcare at [facility] [address], [place], [place], [address] [phone]  Center for [facility] Healthcare at [facility]                                 [address] [place], [place], [place], [address] [phone]  Center for [facility] at [facility]                                    [address], [place], [place],  [address] [phone]  Center for [facility] Healthcare at [facility] [address], [place], [place], [address]                              [facility] [address], [address], [place], [place], [address] [phone]  [facility]         Phone: [phone]  [facility]      Phone: [phone]   [facility]      Phone: [phone]  [facility]-Family Planning         Phone: [phone]   [facility] Health Department-Maternity    Phone: [phone]  [facility]      Phone: [phone]  [facility]     Phone: [phone]  [facility]        Phone: [phone]  [facility]) [phone]  [facility]      Phone: [phone]   Safe Medications in Pregnancy   Acne:  Benzoyl Peroxide  Salicylic Acid   Backache/Headache:  Tylenol: 2 regular strength every 4 hours OR               2 Extra strength every 6 hours   Colds/Coughs/Allergies:  Benadryl (alcohol free) 25 mg every 6 hours as needed  Breath right strips  Claritin  Cepacol throat lozenges  Chloraseptic throat spray  Cold-Eeze- up to three times per day  Cough drops, alcohol free  Flonase (by prescription only)  Guaifenesin  Mucinex  Robitussin DM (plain only, alcohol free)  Saline nasal spray/drops  Sudafed (pseudoephedrine) & Actifed * use only after [redacted] weeks gestation and if you do not have high blood pressure  Tylenol  Vicks Vaporub  Zinc lozenges  Zyrtec   Constipation:  Colace  Ducolax suppositories  Fleet enema  Glycerin suppositories  Metamucil  Milk of magnesia  Miralax  Senokot  Smooth move tea   Diarrhea:  Kaopectate  Imodium A-D   *NO pepto Bismol   Hemorrhoids:  Anusol  Anusol HC  Preparation H  Tucks   Indigestion:   Tums  Maalox  Mylanta  Zantac  Pepcid   Insomnia:  Benadryl (alcohol free) 25mg  every 6 hours as needed  Tylenol PM  Unisom, no Gelcaps   Leg Cramps:  Tums  MagGel  Nausea/Vomiting:  Bonine  Dramamine  Emetrol  Ginger extract  Sea bands  Meclizine  Nausea medication to take during pregnancy:  Unisom (doxylamine succinate 25 mg tablets) Take one tablet daily at bedtime. If symptoms are not adequately controlled, the dose can be increased to a maximum recommended dose of two tablets daily (1/2 tablet in the morning, 1/2 tablet mid-afternoon and one at bedtime).  Vitamin B6 100mg  tablets. Take one tablet twice a day (up to 200 mg per day).   Skin Rashes:  Aveeno products  Benadryl cream or 25mg  every 6 hours as needed  Calamine Lotion  1% cortisone cream   Yeast infection:  Gyne-lotrimin 7  Monistat 7    **If taking multiple medications, please check labels to avoid duplicating the same active ingredients  **take medication as directed on the label  ** Do not exceed 4000 mg of tylenol in 24 hours  **Do not take medications that contain aspirin or ibuprofen

## 2023-01-16 NOTE — MAU Note (Signed)
Ana Lloyd is a 22 y.o. at Unknown here in MAU reporting: she's had abdominal pain for the past 2 days.  States pain is intermittent and similar to period cramps.  Denies VB.  Hasn't taken HPT.  Also c/o light headed and dizziness while working. LMP: 11/24/2022 Onset of complaint: 2 days  Pain score: 4 Vitals:   01/16/23 1739  BP: 109/64  Pulse: 96  Resp: 18  Temp: 98.4 F (36.9 C)  SpO2: 100%     FHT:NA Lab orders placed from triage:   UPT

## 2023-01-17 LAB — GC/CHLAMYDIA PROBE AMP (~~LOC~~) NOT AT ARMC
Chlamydia: NEGATIVE
Comment: NEGATIVE
Comment: NORMAL
Neisseria Gonorrhea: NEGATIVE

## 2023-01-17 LAB — ABO/RH: ABO/RH(D): AB POS

## 2023-01-20 ENCOUNTER — Inpatient Hospital Stay (HOSPITAL_COMMUNITY)
Admission: AD | Admit: 2023-01-20 | Discharge: 2023-01-20 | Disposition: A | Discharge status: Home / Self Care | Payer: Medicaid Other | Attending: Obstetrics and Gynecology | Admitting: Obstetrics and Gynecology

## 2023-01-20 DIAGNOSIS — O218 Other vomiting complicating pregnancy: Secondary | ICD-10-CM | POA: Insufficient documentation

## 2023-01-20 DIAGNOSIS — R112 Nausea with vomiting, unspecified: Secondary | ICD-10-CM | POA: Diagnosis not present

## 2023-01-20 DIAGNOSIS — Z3A01 Less than 8 weeks gestation of pregnancy: Secondary | ICD-10-CM | POA: Insufficient documentation

## 2023-01-20 DIAGNOSIS — R1110 Vomiting, unspecified: Secondary | ICD-10-CM | POA: Diagnosis present

## 2023-01-20 LAB — URINALYSIS, ROUTINE W REFLEX MICROSCOPIC
Bilirubin Urine: NEGATIVE
Glucose, UA: NEGATIVE mg/dL
Hgb urine dipstick: NEGATIVE
Ketones, ur: NEGATIVE mg/dL
Nitrite: NEGATIVE
Protein, ur: NEGATIVE mg/dL
Specific Gravity, Urine: 1.024 (ref 1.005–1.030)
pH: 6 (ref 5.0–8.0)

## 2023-01-20 MED ORDER — ONDANSETRON 4 MG PO TBDP
8.0000 mg | ORAL_TABLET | Freq: Once | ORAL | Status: AC
  Administered 2023-01-20: 8 mg via ORAL
  Filled 2023-01-20: qty 2

## 2023-01-20 MED ORDER — SCOPOLAMINE 1 MG/3DAYS TD PT72
1.0000 | MEDICATED_PATCH | Freq: Once | TRANSDERMAL | Status: DC
  Administered 2023-01-20: 1.5 mg via TRANSDERMAL
  Filled 2023-01-20: qty 1

## 2023-01-20 MED ORDER — SCOPOLAMINE 1 MG/3DAYS TD PT72: 1.0000 | MEDICATED_PATCH | Freq: Once | TRANSDERMAL | 2 refills | Status: AC

## 2023-01-20 NOTE — MAU Provider Note (Signed)
History     CSN: [id number]  Arrival date and time: 01/20/23 1754   Event Date/Time   First Provider Initiated Contact with Patient 01/20/23 2142      Chief Complaint  Patient presents with   Nausea   Emesis   Ana Lloyd , a  22 y.o. G2P1001 at [redacted]w[redacted]d presents to MAU with complaints of nausea and vomiting for the last 2 days. Patient reports that her nausea and vomiting is worsened by her job. She currently works at [employer] and after several hours of smelling food she is unable to keep anything down. She reports that she takes 8mg  of Zofran and it "does not help." She states that she has had 6 episodes of vomiting today, last one was 5 hours ago. She is currently requesting something to drink.           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   Insomnia 11/20/2015   Kidney infection    Kidney stone    x3    Obesity    Strep throat    UTI (lower urinary tract infection)     Past Surgical History:  Procedure Laterality Date   ADENOIDECTOMY     CESAREAN SECTION N/A 04/03/2019   Procedure: CESAREAN SECTION;  Surgeon: Levie Heritage, DO;  Location: [facility];  Service: Obstetrics;  Laterality: N/A;   TONSILLECTOMY      Family History  Problem Relation Age of Onset   Diabetes Other    Hypertension Other     Social History   Tobacco Use   Smoking status: Former    Current packs/day: 0.50    Types: Cigarettes   Smokeless tobacco: Never   Tobacco comments:    none since +UPT  Vaping Use   Vaping status: Never Used  Substance Use Topics   Alcohol use: Not Currently    Comment: occasional   Drug use: Not Currently    Types: Marijuana    Comment: last was 08/15/18    Allergies:  Allergies  Allergen  Reactions   Bactrim [Sulfamethoxazole-Trimethoprim] Hives and Itching    Medications Prior to Admission  Medication Sig Dispense Refill Last Dose/Taking   ondansetron (ZOFRAN-ODT) 4 MG disintegrating tablet Take 1 tablet (4 mg total) by mouth every 8 (eight) hours as needed for nausea or vomiting. 20 tablet 0 01/20/2023 at  1:00 PM   acetaminophen (TYLENOL) 325 MG tablet Take 2 tablets (650 mg total) by mouth every 6 (six) hours as needed. 36 tablet 0    albuterol (VENTOLIN HFA) 108 (90 Base) MCG/ACT inhaler Inhale 1-2 puffs into the lungs every 6 (six) hours as needed for wheezing or shortness of breath. 1 each 0    ferrous sulfate 325 (65 FE) MG tablet Take 1 tablet (325 mg total) by mouth 2 (two) times daily with a meal. 60 tablet 3    Prenatal Vit-Fe Fumarate-FA (PREPLUS) 27-1 MG TABS Take 1 tablet by mouth daily. 30 tablet 13  Review of Systems  Constitutional:  Negative for chills, fatigue and fever.  Eyes:  Negative for pain and visual disturbance.  Respiratory:  Negative for apnea, shortness of breath and wheezing.   Cardiovascular:  Negative for chest pain and palpitations.  Gastrointestinal:  Positive for nausea and vomiting. Negative for abdominal pain, constipation and diarrhea.  Genitourinary:  Negative for difficulty urinating, dysuria, pelvic pain, vaginal bleeding, vaginal discharge and vaginal pain.  Musculoskeletal:  Negative for back pain.  Neurological:  Negative for seizures, weakness and headaches.  Psychiatric/Behavioral:  Negative for suicidal ideas.    Physical Exam   Blood pressure (!) 108/58, pulse 83, temperature 98 F (36.7 C), temperature source Oral, resp. rate 16, height 5\' 4"  (1.626 m), weight 69.9 kg, last menstrual period 11/24/2022, SpO2 100%, unknown if currently breastfeeding.  Physical Exam Vitals and nursing note reviewed.  Constitutional:      General: She is not in acute distress.    Appearance: Normal appearance.  HENT:     Head:  Normocephalic.  Pulmonary:     Effort: Pulmonary effort is normal.  Musculoskeletal:     Cervical back: Normal range of motion.  Skin:    General: Skin is warm and dry.  Neurological:     Mental Status: She is alert and oriented to person, place, and time.  Psychiatric:        Mood and Affect: Mood normal.     MAU Course  Procedures Results for orders placed or performed during the hospital encounter of 01/20/23 (from the past 24 hours)  Urinalysis, Routine w reflex microscopic -Urine, Clean Catch     Status: Abnormal   Collection Time: 01/20/23  6:30 PM  Result Value Ref Range   Color, Urine YELLOW YELLOW   APPearance CLOUDY (A) CLEAR   Specific Gravity, Urine 1.024 1.005 - 1.030   pH 6.0 5.0 - 8.0   Glucose, UA NEGATIVE NEGATIVE mg/dL   Hgb urine dipstick NEGATIVE NEGATIVE   Bilirubin Urine NEGATIVE NEGATIVE   Ketones, ur NEGATIVE NEGATIVE mg/dL   Protein, ur NEGATIVE NEGATIVE mg/dL   Nitrite NEGATIVE NEGATIVE   Leukocytes,Ua TRACE (A) NEGATIVE   RBC / HPF 0-5 0 - 5 RBC/hpf   WBC, UA 0-5 0 - 5 WBC/hpf   Bacteria, UA FEW (A) NONE SEEN   Squamous Epithelial / HPF 11-20 0 - 5 /HPF   Mucus PRESENT    Amorphous Crystal PRESENT    Orders Placed This Encounter  Procedures   Urinalysis, Routine w reflex microscopic -Urine, Clean Catch   Discharge patient   No current facility-administered medications on file prior to encounter.   Current Outpatient Medications on File Prior to Encounter  Medication Sig Dispense Refill   ondansetron (ZOFRAN-ODT) 4 MG disintegrating tablet Take 1 tablet (4 mg total) by mouth every 8 (eight) hours as needed for nausea or vomiting. 20 tablet 0   acetaminophen (TYLENOL) 325 MG tablet Take 2 tablets (650 mg total) by mouth every 6 (six) hours as needed. 36 tablet 0   albuterol (VENTOLIN HFA) 108 (90 Base) MCG/ACT inhaler Inhale 1-2 puffs into the lungs every 6 (six) hours as needed for wheezing or shortness of breath. 1 each 0   ferrous sulfate  325 (65 FE) MG tablet Take 1 tablet (325 mg total) by mouth 2 (two) times daily with a meal. 60 tablet 3   Prenatal Vit-Fe Fumarate-FA (PREPLUS) 27-1 MG TABS Take 1 tablet by mouth daily. 30 tablet 13    MDM -  Antiemetics ordered  - Reviewed [place] results and noted low suspicion for dehydration.  - PO Challenged  - PO challenge successful  - plan for discharge.   Assessment and Plan   1. Nausea and vomiting, unspecified vomiting type   2. [redacted] weeks gestation of pregnancy    - Reviewed worsening signs and return precautions  - Rx for scop patch sent to outpatient pharmacy.  - Recommended to establish care at the facility of her choosing.  - Patient discharged home in stable condition and may return to MAU as needed.   Claudette Head, MSN CNM  01/20/2023, 9:42 PM

## 2023-01-20 NOTE — MAU Note (Signed)
...  Ana Lloyd is a 22 y.o. at [redacted]w[redacted]d here in MAU reporting: N/V x2 days. She reports around 6 episodes of emesis today. Last attempted to eat around 1500 and vomited around 5-10 minutes later. Last smoked marijuana two days ago. Denies VB. Denies pain.  Last doses: Zofran ODT 8 mg 1300  Onset of complaint: x2 days Pain score: Denies pain.  Vitals:   01/20/23 1816  BP: (!) 108/58  Pulse: 83  Resp: 16  Temp: 98 F (36.7 C)  SpO2: 100%     FHT: n/a Lab orders placed from triage: UA

## 2023-02-21 ENCOUNTER — Encounter: Payer: Medicaid Other | Admitting: Family Medicine

## 2023-03-02 ENCOUNTER — Ambulatory Visit: Payer: Medicaid Other | Admitting: *Deleted

## 2023-03-02 ENCOUNTER — Telehealth: Payer: Self-pay | Admitting: *Deleted

## 2023-03-02 DIAGNOSIS — O0990 Supervision of high risk pregnancy, unspecified, unspecified trimester: Secondary | ICD-10-CM | POA: Insufficient documentation

## 2023-03-02 DIAGNOSIS — O0991 Supervision of high risk pregnancy, unspecified, first trimester: Secondary | ICD-10-CM | POA: Diagnosis not present

## 2023-03-02 DIAGNOSIS — Z3A13 13 weeks gestation of pregnancy: Secondary | ICD-10-CM | POA: Diagnosis not present

## 2023-03-02 HISTORY — DX: Supervision of high risk pregnancy, unspecified, unspecified trimester: O09.90

## 2023-03-02 MED ORDER — BLOOD PRESSURE KIT DEVI
1.0000 | 0 refills | Status: DC
Start: 2023-03-02 — End: 2023-09-01

## 2023-03-02 NOTE — Patient Instructions (Signed)

## 2023-03-02 NOTE — Progress Notes (Signed)
New OB Intake  I connected with Lundyn L Bann  on 03/02/23 at  3:10 PM EST by Telephone Visit and verified that I am speaking with the correct person using two identifiers. Nurse is located at [facility] and pt is located at Home.  I discussed the limitations, risks, security and privacy concerns of performing an evaluation and management service by telephone and the availability of in person appointments. I also discussed with the patient that there may be a patient responsible charge related to this service. The patient expressed understanding and agreed to proceed.  I explained I am completing New OB Intake today. We discussed EDD of 09/08/2023, by Ultrasound. Pt is G2P1001. I reviewed her allergies, medications and Medical/Surgical/OB history.    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 Discussed the nature of our practice with multiple providers including residents and students. Due to the size of the practice, the delivering provider may not be the same as those providing prenatal care.   Patient is not a candidate for water birth. Offered upcoming OB visit with CNM to discuss further.  MyChart/Babyscripts MyChart access verified. I explained pt will have some visits in office and some virtually. Babyscripts instructions given and order placed. Patient verifies receipt of registration text/e-mail.  Account successfully created and app downloaded. If patient is a candidate for Optimized scheduling, add to sticky note.   Blood Pressure Cuff/Weight Scale Blood pressure cuff ordered for patient to pick-up from [employer]. Explained after first prenatal appt pt will check weekly and document in Babyscripts. Patient does not have weight scale; patient may purchase if they desire to track weight weekly in Babyscripts.  Anatomy US Explained first scheduled [place] will be around 19 weeks. Anatomy US scheduled for TBD at TBD.  Interested in [place]? If yes, send referral and doula dot phrase.   Is patient a candidate for Babyscripts Optimization? No, due to Risk Factors   First visit review I reviewed new OB appt with patient. Explained pt will be seen by Dr. Debroah Loop at first visit. Discussed Avelina Laine genetic screening with patient. Requests Panorama and Horizon.. Routine prenatal labs  not collected/ virtual visit.    Last Pap No results found for: "DIAGPAP"  Harrel Lemon, RN 03/02/2023  4:45 PM

## 2023-03-02 NOTE — Telephone Encounter (Signed)
TC to complete OB Intake. Voice mailbox full. MyChart message sent to patient. Awaiting reply.

## 2023-03-28 ENCOUNTER — Encounter: Payer: Medicaid Other | Admitting: Obstetrics & Gynecology

## 2023-03-28 DIAGNOSIS — O0990 Supervision of high risk pregnancy, unspecified, unspecified trimester: Secondary | ICD-10-CM

## 2023-03-28 DIAGNOSIS — Z3A16 16 weeks gestation of pregnancy: Secondary | ICD-10-CM

## 2023-04-13 DIAGNOSIS — Z3A19 19 weeks gestation of pregnancy: Secondary | ICD-10-CM | POA: Diagnosis not present

## 2023-04-13 DIAGNOSIS — Z363 Encounter for antenatal screening for malformations: Secondary | ICD-10-CM | POA: Diagnosis not present

## 2023-04-13 DIAGNOSIS — N912 Amenorrhea, unspecified: Secondary | ICD-10-CM | POA: Diagnosis not present

## 2023-04-14 ENCOUNTER — Ambulatory Visit: Payer: Medicaid Other

## 2023-04-14 ENCOUNTER — Other Ambulatory Visit: Payer: Medicaid Other

## 2023-04-27 DIAGNOSIS — Z369 Encounter for antenatal screening, unspecified: Secondary | ICD-10-CM | POA: Diagnosis not present

## 2023-04-27 DIAGNOSIS — Z113 Encounter for screening for infections with a predominantly sexual mode of transmission: Secondary | ICD-10-CM | POA: Diagnosis not present

## 2023-04-27 DIAGNOSIS — N912 Amenorrhea, unspecified: Secondary | ICD-10-CM | POA: Diagnosis not present

## 2023-04-27 LAB — HEPATITIS C ANTIBODY: HCV Ab: NEGATIVE

## 2023-04-27 LAB — OB RESULTS CONSOLE RUBELLA ANTIBODY, IGM: Rubella: IMMUNE

## 2023-04-27 LAB — OB RESULTS CONSOLE RPR: RPR: NONREACTIVE

## 2023-04-27 LAB — OB RESULTS CONSOLE HIV ANTIBODY (ROUTINE TESTING): HIV: NONREACTIVE

## 2023-04-27 LAB — OB RESULTS CONSOLE HEPATITIS B SURFACE ANTIGEN: Hepatitis B Surface Ag: NEGATIVE

## 2023-06-16 DIAGNOSIS — Z369 Encounter for antenatal screening, unspecified: Secondary | ICD-10-CM | POA: Diagnosis not present

## 2023-07-24 ENCOUNTER — Other Ambulatory Visit: Payer: Self-pay | Admitting: Obstetrics and Gynecology

## 2023-07-25 ENCOUNTER — Other Ambulatory Visit: Payer: Self-pay | Admitting: Obstetrics and Gynecology

## 2023-08-15 ENCOUNTER — Encounter (HOSPITAL_COMMUNITY): Payer: Self-pay | Admitting: Obstetrics and Gynecology

## 2023-08-15 ENCOUNTER — Other Ambulatory Visit: Payer: Self-pay

## 2023-08-15 ENCOUNTER — Inpatient Hospital Stay (HOSPITAL_COMMUNITY)
Admission: AD | Admit: 2023-08-15 | Discharge: 2023-08-15 | Disposition: A | Discharge status: Home / Self Care | Attending: Obstetrics and Gynecology | Admitting: Obstetrics and Gynecology

## 2023-08-15 DIAGNOSIS — R1030 Lower abdominal pain, unspecified: Secondary | ICD-10-CM | POA: Diagnosis not present

## 2023-08-15 DIAGNOSIS — Z3A36 36 weeks gestation of pregnancy: Secondary | ICD-10-CM | POA: Diagnosis not present

## 2023-08-15 DIAGNOSIS — O23593 Infection of other part of genital tract in pregnancy, third trimester: Secondary | ICD-10-CM | POA: Insufficient documentation

## 2023-08-15 DIAGNOSIS — B9689 Other specified bacterial agents as the cause of diseases classified elsewhere: Secondary | ICD-10-CM

## 2023-08-15 DIAGNOSIS — M545 Low back pain, unspecified: Secondary | ICD-10-CM | POA: Diagnosis not present

## 2023-08-15 DIAGNOSIS — N760 Acute vaginitis: Secondary | ICD-10-CM | POA: Diagnosis not present

## 2023-08-15 DIAGNOSIS — O26893 Other specified pregnancy related conditions, third trimester: Secondary | ICD-10-CM | POA: Insufficient documentation

## 2023-08-15 DIAGNOSIS — M549 Dorsalgia, unspecified: Secondary | ICD-10-CM

## 2023-08-15 DIAGNOSIS — O99891 Other specified diseases and conditions complicating pregnancy: Secondary | ICD-10-CM

## 2023-08-15 LAB — WET PREP, GENITAL
Sperm: NONE SEEN
Trich, Wet Prep: NONE SEEN
WBC, Wet Prep HPF POC: <10 (ref <10)
Yeast Wet Prep HPF POC: NONE SEEN

## 2023-08-15 MED ORDER — ACETAMINOPHEN 500 MG PO TABS
1000.0000 mg | ORAL_TABLET | Freq: Once | ORAL | Status: AC
  Administered 2023-08-15: 1000 mg via ORAL
  Filled 2023-08-15: qty 2

## 2023-08-15 MED ORDER — CYCLOBENZAPRINE HCL 5 MG PO TABS
10.0000 mg | ORAL_TABLET | Freq: Once | ORAL | Status: AC
  Administered 2023-08-15: 10 mg via ORAL
  Filled 2023-08-15: qty 2

## 2023-08-15 MED ORDER — METRONIDAZOLE 500 MG PO TABS: 500.0000 mg | ORAL_TABLET | Freq: Two times a day (BID) | ORAL | 0 refills | Status: DC

## 2023-08-15 MED ORDER — CYCLOBENZAPRINE HCL 10 MG PO TABS
10.0000 mg | ORAL_TABLET | Freq: Two times a day (BID) | ORAL | 0 refills | Status: DC | PRN
Start: 2023-08-15 — End: 2023-09-01

## 2023-08-15 NOTE — MAU Provider Note (Cosign Needed Addendum)
 MAU Provider Note  Chief Complaint: Back Pain and Abdominal Pain     SUBJECTIVE HPI: Ana Lloyd is a 23 y.o. G2P1001 at [redacted]w[redacted]d by early ultrasound who presents to maternity admissions reporting lower abdominal pain, low back pain, light leaking of fluid.  Receives [facility] with central [place]. Several days ago, she started having increased vaginal discharge. No color or odor noted, but she has been changing her underwear throughout the day. Since yesterday, she has been having low back pain and lower abdominal pain. She did not have an inciting event.  Her pain is worse with movement.  When she lies down she does not have pain.  At its worst is a 7 out of 10.  She has not tried anything for her pain.  Good fetal movement.  No vaginal bleeding. no vaginal itching or dysuria.    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   Insomnia 11/20/2015   Kidney infection    Kidney stone    x3    Obesity    Strep throat    UTI (lower urinary tract infection)    Past Surgical History:  Procedure Laterality Date   ADENOIDECTOMY     CESAREAN SECTION N/A 04/03/2019   Procedure: CESAREAN SECTION;  Surgeon: Barbra Lang PARAS, DO;  Location: [facility] LD [facility]S;  Service: Obstetrics;  Laterality: N/A;   TONSILLECTOMY     Social History   Socioeconomic History   Marital status: Single    Spouse name: Not on file   Number of children: Not on file   Years of education: Not on file   Highest education level: Not on file  Occupational History   Not on file  Tobacco Use   Smoking status: Former    Current packs/day: 0.50    Types: Cigarettes   Smokeless tobacco: Never   Tobacco comments:    none since +UPT  Vaping Use   Vaping status: Never Used  Substance and Sexual Activity   Alcohol use: Not Currently    Comment: occasional   Drug use: Yes    Types: Marijuana    Comment: last smoked 08/13/2023   Sexual activity: Yes     Partners: Male    Birth control/protection: None    Comment: Pregnant   Other Topics Concern   Not on file  Social History Narrative   Not on file   Social Drivers of Health   Financial Resource Strain: Not on file  Food Insecurity: Not on file  Transportation Needs: Not on file  Physical Activity: Not on file  Stress: Not on file  Social Connections: Not on file  Intimate Partner Violence: Not on file   No current facility-administered medications on file prior to encounter.   Current Outpatient Medications on File Prior to Encounter  Medication Sig Dispense Refill   acetaminophen  (TYLENOL ) 325 MG tablet Take 2 tablets (650 mg total) by mouth every 6 (six) hours as needed. (Patient not taking: Reported on 03/02/2023) 36 tablet 0   albuterol  (VENTOLIN  HFA) 108 (90 Base) MCG/ACT inhaler Inhale 1-2 puffs into the lungs every 6 (six) hours as needed for wheezing or shortness of breath. (Patient not taking: Reported on 03/02/2023) 1 each 0   Blood Pressure Monitoring (BLOOD PRESSURE KIT) DEVI 1 Device by Does not apply route once a week. 1 each 0   ferrous sulfate  325 (65 FE) MG tablet Take 1 tablet (325 mg total) by  mouth 2 (two) times daily with a meal. 60 tablet 3   ondansetron  (ZOFRAN -ODT) 4 MG disintegrating tablet Take 1 tablet (4 mg total) by mouth every 8 (eight) hours as needed for nausea or vomiting. (Patient not taking: Reported on 03/02/2023) 20 tablet 0   Prenatal Vit-Fe Fumarate-FA (PREPLUS) 27-1 MG TABS Take 1 tablet by mouth daily. 30 tablet 13   scopolamine  (TRANSDERM-SCOP) 1 MG/3DAYS Place 1 patch onto the skin every 3 (three) days.     No Known Allergies   ROS:  Pertinent positives/negatives listed above.  I have reviewed patient's Past Medical Hx, Surgical Hx, Family Hx, Social Hx, medications and allergies.   Physical Exam  Patient Vitals for the past 24 hrs:  BP Temp Temp src Pulse Resp SpO2 Height Weight  08/15/23 1350 115/65 -- -- (!) 120 -- 96 % -- --   08/15/23 1329 (!) 115/57 99 F (37.2 C) Oral (!) 108 16 98 % 5' 4 (1.626 m) 83.9 kg   Constitutional: Well-developed, well-nourished female in no acute distress  Cardiovascular: normal rate (92 on my exam), no murmurs rubs gallops Respiratory: normal effort, clear to auscultation bilaterally GI: Abd soft, mild tenderness in the right and left lower quadrants Lumbar: Tenderness over the lower paraspinal muscles, no spinous process tenderness, increased tone in the paraspinals, no CVA tenderness, good lumbar range of motion, pain alleviated with forward flexion, exacerbated with extension Neurologic: Alert and oriented x 4   FHT:  Baseline 145 , moderate variability, accelerations present, no decelerations Contractions: q 30 min  LAB RESULTS Results for orders placed or performed during the hospital encounter of 08/15/23 (from the past 24 hours)  Wet prep, genital     Status: Abnormal   Collection Time: 08/15/23  2:52 PM   Specimen: Vaginal  Result Value Ref Range   Yeast Wet Prep HPF POC NONE SEEN NONE SEEN   Trich, Wet Prep NONE SEEN NONE SEEN   Clue Cells Wet Prep HPF POC PRESENT (A) NONE SEEN   WBC, Wet Prep HPF POC <10 <10   Sperm NONE SEEN     --/--/AB POS (12/09 1829)  IMAGING No results found.  MAU Management/MDM: Orders Placed This Encounter  Procedures   Wet prep, genital   Discharge patient Discharge disposition: 01-Home or Self Care; Discharge patient date: 08/15/2023    Meds ordered this encounter  Medications   acetaminophen  (TYLENOL ) tablet 1,000 mg   cyclobenzaprine  (FLEXERIL ) tablet 10 mg   cyclobenzaprine  (FLEXERIL ) 10 MG tablet    Sig: Take 1 tablet (10 mg total) by mouth 2 (two) times daily as needed for muscle spasms.    Dispense:  20 tablet    Refill:  0    Supervising Provider:   PRATT, TANYA S [[id number]]   metroNIDAZOLE  (FLAGYL ) 500 MG tablet    Sig: Take 1 tablet (500 mg total) by mouth 2 (two) times daily.    Dispense:  14 tablet    Refill:  0     Supervising Provider:   FREDIRICK GLENYS GORMAN KATHRINE     Available prenatal records reviewed.  -Lumbar pain likely due to to myofascial pain and present pregnancy.  With increased discharge, want to rule out vaginal infection.  Abdominal pain not due to to contractions, per toco, likely due to round ligament pain in pregnancy. - Tylenol  1000 mg and Flexeril  10 mg given during encounter for pain relief -Prescribed Tylenol  160 as needed and Flexeril  10 mg twice daily as needed -Wet prep, UA,  and GC swabs collected;  UA within normal limits, wet prep positive for bacterial vaginosis, GC pending - Prescribed Flagyl  500 mg BID for 7 days for BV -Patient will follow-up with routine OB provider  ASSESSMENT 1. Back pain complicating pregnancy in first trimester   2. Bacterial vaginosis   3. [redacted] weeks gestation of pregnancy     PLAN  - patient will follow up with outpatient OB as scheduled - Discharge home with strict return precautions.  Allergies as of 08/15/2023   No Known Allergies      Medication List     TAKE these medications    acetaminophen  325 MG tablet Commonly known as: Tylenol  Take 2 tablets (650 mg total) by mouth every 6 (six) hours as needed.   albuterol  108 (90 Base) MCG/ACT inhaler Commonly known as: VENTOLIN  HFA Inhale 1-2 puffs into the lungs every 6 (six) hours as needed for wheezing or shortness of breath.   Blood Pressure Kit Devi 1 Device by Does not apply route once a week.   cyclobenzaprine  10 MG tablet Commonly known as: FLEXERIL  Take 1 tablet (10 mg total) by mouth 2 (two) times daily as needed for muscle spasms.   ferrous sulfate  325 (65 FE) MG tablet Take 1 tablet (325 mg total) by mouth 2 (two) times daily with a meal.   metroNIDAZOLE  500 MG tablet Commonly known as: FLAGYL  Take 1 tablet (500 mg total) by mouth 2 (two) times daily.   ondansetron  4 MG disintegrating tablet Commonly known as: ZOFRAN -ODT Take 1 tablet (4 mg total) by mouth every 8  (eight) hours as needed for nausea or vomiting.   PrePLUS 27-1 MG Tabs Take 1 tablet by mouth daily.   scopolamine  1 MG/3DAYS Commonly known as: TRANSDERM-SCOP Place 1 patch onto the skin every 3 (three) days.        Follow-up Information     Ob/Gyn, [place] Follow up.   Specialty: Obstetrics and Gynecology Why: for routinely scheduled OB appointment Contact information: [address] [place] [address] [phone]                 Elio Alena Morrison, MD Family Medicine PGY1 08/15/2023  3:18 PM   Attestation of Supervision of Student:  I confirm that I have verified the information documented in the Resident Physician's  note and that I have also personally reperformed the history, physical exam and all medical decision making activities.  I have verified that all services and findings are accurately documented in this student's note; and I agree with management and plan as outlined in the documentation. I have also made any necessary editorial changes.  I was personally present during the encounter and verified the HPI, PE , interpreted the lab results and NST and A/P was verified by me prior to the patient's discharge in stable condition  Olam DELENA Dalton, NP [facility] [employer], [facility] 08/15/2023 8:02 PM

## 2023-08-15 NOTE — Discharge Instructions (Signed)
 Follow up with your [facility] Provider as scheduled

## 2023-08-15 NOTE — MAU Note (Signed)
 Ana Lloyd is a 23 y.o. at [redacted]w[redacted]d here in MAU reporting: lower abd and lower back pain when moving starting last night 1900 and some light leaking of fluid. NO c/o vaginal bleeding, HA, chest pain, or visual disturbances.  +FM   LMP: na Onset of complaint: 1900 7/7 Pain score: 5/10 Vitals:   08/15/23 1329  BP: (!) 115/57  Pulse: (!) 108  Resp: 16  Temp: 99 F (37.2 C)  SpO2: 98%     FHT: 154  Lab orders placed from triage: na

## 2023-08-16 LAB — GC/CHLAMYDIA PROBE AMP (~~LOC~~) NOT AT ARMC
Chlamydia: NEGATIVE
Comment: NEGATIVE
Comment: NORMAL
Neisseria Gonorrhea: NEGATIVE

## 2023-08-21 NOTE — Patient Instructions (Signed)
 Female L Bartle  08/21/2023   Your procedure is scheduled on:  09/02/2023  Arrive at 0800 at Entrance C on [address] at [facility]  and [employer]. You are invited to use the FREE valet parking or use the Visitor's parking deck.  Pick up the phone at the desk and dial [phone].  Call this number if you have problems the morning of surgery: [phone]  Remember:   Do not eat food:(After Midnight) Desps de medianoche.  You may drink clear liquids until  __0600___.  Clear liquids means a liquid you can see thru.  It can have color such as Cola or Kool aid.  Tea is OK and coffee as long as no milk or creamer of any kind.  Take these medicines the morning of surgery with A SIP OF WATER:  Bring your inhaler   Do not wear jewelry, make-up or nail polish.  Do not wear lotions, powders, or perfumes. Do not wear deodorant.  Do not shave 48 hours prior to surgery.  Do not bring valuables to the hospital.  [facility] is not   responsible for any belongings or valuables brought to the hospital.  Contacts, dentures or bridgework may not be worn into surgery.  Leave suitcase in the car. After surgery it may be brought to your room.  For patients admitted to the hospital, checkout time is 11:00 AM the day of              discharge.      Please read over the following fact sheets that you were given:     Preparing for Surgery

## 2023-08-23 ENCOUNTER — Encounter (HOSPITAL_COMMUNITY): Payer: Self-pay

## 2023-08-31 ENCOUNTER — Encounter (HOSPITAL_COMMUNITY)
Admission: RE | Admit: 2023-08-31 | Discharge: 2023-08-31 | Disposition: A | Discharge status: Home / Self Care | Source: Ambulatory Visit | Attending: Obstetrics and Gynecology | Admitting: Obstetrics and Gynecology

## 2023-08-31 DIAGNOSIS — Z01812 Encounter for preprocedural laboratory examination: Secondary | ICD-10-CM | POA: Diagnosis present

## 2023-08-31 LAB — CBC
HCT: 38.8 % (ref 36.0–46.0)
Hemoglobin: 12.8 g/dL (ref 12.0–15.0)
MCH: 30.5 pg (ref 26.0–34.0)
MCHC: 33 g/dL (ref 30.0–36.0)
MCV: 92.6 fL (ref 80.0–100.0)
Platelets: 323 K/uL (ref 150–400)
RBC: 4.19 MIL/uL (ref 3.87–5.11)
RDW: 14.5 % (ref 11.5–15.5)
WBC: 18.1 K/uL — ABNORMAL HIGH (ref 4.0–10.5)
nRBC: 0 % (ref 0.0–0.2)

## 2023-08-31 LAB — TYPE AND SCREEN
ABO/RH(D): AB POS
Antibody Screen: NEGATIVE

## 2023-08-31 LAB — RPR: RPR Ser Ql: NONREACTIVE

## 2023-09-02 ENCOUNTER — Other Ambulatory Visit: Payer: Self-pay

## 2023-09-02 ENCOUNTER — Encounter (HOSPITAL_COMMUNITY)
Admission: RE | Disposition: A | Discharge status: Home / Self Care | Payer: Self-pay | Source: Home / Self Care | Attending: Obstetrics and Gynecology

## 2023-09-02 ENCOUNTER — Inpatient Hospital Stay (HOSPITAL_COMMUNITY): Admitting: Anesthesiology

## 2023-09-02 ENCOUNTER — Encounter (HOSPITAL_COMMUNITY): Payer: Self-pay | Admitting: Obstetrics and Gynecology

## 2023-09-02 ENCOUNTER — Inpatient Hospital Stay (HOSPITAL_COMMUNITY)
Admission: RE | Admit: 2023-09-02 | Discharge: 2023-09-04 | DRG: 788 | Disposition: A | Discharge status: Home / Self Care | Attending: Obstetrics and Gynecology | Admitting: Obstetrics and Gynecology

## 2023-09-02 DIAGNOSIS — Z833 Family history of diabetes mellitus: Secondary | ICD-10-CM

## 2023-09-02 DIAGNOSIS — Z8249 Family history of ischemic heart disease and other diseases of the circulatory system: Secondary | ICD-10-CM | POA: Diagnosis not present

## 2023-09-02 DIAGNOSIS — Z3A39 39 weeks gestation of pregnancy: Secondary | ICD-10-CM

## 2023-09-02 DIAGNOSIS — O9962 Diseases of the digestive system complicating childbirth: Secondary | ICD-10-CM | POA: Diagnosis present

## 2023-09-02 DIAGNOSIS — O34211 Maternal care for low transverse scar from previous cesarean delivery: Secondary | ICD-10-CM

## 2023-09-02 DIAGNOSIS — O99214 Obesity complicating childbirth: Secondary | ICD-10-CM | POA: Diagnosis present

## 2023-09-02 DIAGNOSIS — Z9889 Other specified postprocedural states: Principal | ICD-10-CM

## 2023-09-02 DIAGNOSIS — K219 Gastro-esophageal reflux disease without esophagitis: Secondary | ICD-10-CM | POA: Diagnosis not present

## 2023-09-02 DIAGNOSIS — Z98891 History of uterine scar from previous surgery: Secondary | ICD-10-CM

## 2023-09-02 DIAGNOSIS — O9902 Anemia complicating childbirth: Secondary | ICD-10-CM | POA: Diagnosis not present

## 2023-09-02 DIAGNOSIS — Z87891 Personal history of nicotine dependence: Secondary | ICD-10-CM

## 2023-09-02 LAB — GROUP B STREP BY PCR: Group B strep by PCR: NEGATIVE

## 2023-09-02 SURGERY — Surgical Case
Anesthesia: Spinal | Site: Abdomen

## 2023-09-02 MED ORDER — METOCLOPRAMIDE HCL 5 MG/ML IJ SOLN
INTRAMUSCULAR | Status: AC
  Filled 2023-09-02: qty 2

## 2023-09-02 MED ORDER — KETAMINE HCL 50 MG/5ML IJ SOSY
PREFILLED_SYRINGE | INTRAMUSCULAR | Status: AC
Start: 2023-09-02 — End: 2023-09-02
  Filled 2023-09-02: qty 5

## 2023-09-02 MED ORDER — DIPHENHYDRAMINE HCL 25 MG PO CAPS: 25.0000 mg | ORAL_CAPSULE | Freq: Four times a day (QID) | ORAL | Status: DC | PRN

## 2023-09-02 MED ORDER — STERILE WATER FOR IRRIGATION IR SOLN
Status: DC | PRN
  Administered 2023-09-02: 1000 mL

## 2023-09-02 MED ORDER — CEFAZOLIN SODIUM-DEXTROSE 2-4 GM/100ML-% IV SOLN: 2.0000 g | INTRAVENOUS | Status: DC

## 2023-09-02 MED ORDER — POVIDONE-IODINE 10 % EX SOLN: CUTANEOUS | Status: DC | PRN

## 2023-09-02 MED ORDER — DEXMEDETOMIDINE HCL IN NACL 80 MCG/20ML IV SOLN
INTRAVENOUS | Status: DC | PRN
  Administered 2023-09-02 (×2): 8 ug via INTRAVENOUS
  Administered 2023-09-02: 4 ug via INTRAVENOUS
  Administered 2023-09-02: 8 ug via INTRAVENOUS
  Administered 2023-09-02: 4 ug via INTRAVENOUS

## 2023-09-02 MED ORDER — COCONUT OIL OIL: 1.0000 | TOPICAL_OIL | Status: DC | PRN

## 2023-09-02 MED ORDER — OXYTOCIN-SODIUM CHLORIDE 30-0.9 UT/500ML-% IV SOLN
INTRAVENOUS | Status: DC | PRN
  Administered 2023-09-02: 300 mL via INTRAVENOUS

## 2023-09-02 MED ORDER — ACETAMINOPHEN 10 MG/ML IV SOLN
INTRAVENOUS | Status: DC | PRN
  Administered 2023-09-02: 1000 mg via INTRAVENOUS

## 2023-09-02 MED ORDER — PRENATAL MULTIVITAMIN CH
1.0000 | ORAL_TABLET | Freq: Every day | ORAL | Status: DC
  Administered 2023-09-02 – 2023-09-04 (×3): 1 via ORAL
  Filled 2023-09-02 (×3): qty 1

## 2023-09-02 MED ORDER — DEXAMETHASONE SODIUM PHOSPHATE 10 MG/ML IJ SOLN
INTRAMUSCULAR | Status: DC | PRN
  Administered 2023-09-02: 10 mg via INTRAVENOUS

## 2023-09-02 MED ORDER — MORPHINE SULFATE (PF) 0.5 MG/ML IJ SOLN
INTRAMUSCULAR | Status: AC
  Filled 2023-09-02: qty 10

## 2023-09-02 MED ORDER — SOD CITRATE-CITRIC ACID 500-334 MG/5ML PO SOLN
ORAL | Status: AC
  Filled 2023-09-02: qty 30

## 2023-09-02 MED ORDER — IBUPROFEN 600 MG PO TABS
600.0000 mg | ORAL_TABLET | Freq: Four times a day (QID) | ORAL | Status: DC
  Administered 2023-09-03 – 2023-09-04 (×4): 600 mg via ORAL
  Filled 2023-09-02 (×4): qty 1

## 2023-09-02 MED ORDER — METOCLOPRAMIDE HCL 5 MG/ML IJ SOLN
INTRAMUSCULAR | Status: DC | PRN
  Administered 2023-09-02: 10 mg via INTRAVENOUS

## 2023-09-02 MED ORDER — DIBUCAINE (PERIANAL) 1 % EX OINT: 1.0000 | TOPICAL_OINTMENT | CUTANEOUS | Status: DC | PRN

## 2023-09-02 MED ORDER — FENTANYL CITRATE (PF) 100 MCG/2ML IJ SOLN
INTRAMUSCULAR | Status: DC | PRN
  Administered 2023-09-02: 85 ug via INTRAVENOUS

## 2023-09-02 MED ORDER — OXYTOCIN-SODIUM CHLORIDE 30-0.9 UT/500ML-% IV SOLN
INTRAVENOUS | Status: AC
  Filled 2023-09-02: qty 500

## 2023-09-02 MED ORDER — LACTATED RINGERS IV SOLN: INTRAVENOUS | Status: DC

## 2023-09-02 MED ORDER — FENTANYL CITRATE (PF) 100 MCG/2ML IJ SOLN
INTRAMUSCULAR | Status: AC
  Filled 2023-09-02: qty 2

## 2023-09-02 MED ORDER — SENNOSIDES-DOCUSATE SODIUM 8.6-50 MG PO TABS
2.0000 | ORAL_TABLET | Freq: Every day | ORAL | Status: DC
  Administered 2023-09-03 – 2023-09-04 (×2): 2 via ORAL
  Filled 2023-09-02 (×2): qty 2

## 2023-09-02 MED ORDER — KETAMINE HCL 10 MG/ML IJ SOLN
INTRAMUSCULAR | Status: DC | PRN
  Administered 2023-09-02: 10 mg via INTRAVENOUS

## 2023-09-02 MED ORDER — FENTANYL CITRATE (PF) 100 MCG/2ML IJ SOLN
INTRAMUSCULAR | Status: DC | PRN
  Administered 2023-09-02: 15 ug via INTRATHECAL

## 2023-09-02 MED ORDER — SODIUM CHLORIDE 0.9% FLUSH: 3.0000 mL | INTRAVENOUS | Status: DC | PRN

## 2023-09-02 MED ORDER — WITCH HAZEL-GLYCERIN EX PADS: 1.0000 | MEDICATED_PAD | CUTANEOUS | Status: DC | PRN

## 2023-09-02 MED ORDER — CEFAZOLIN SODIUM-DEXTROSE 2-4 GM/100ML-% IV SOLN
INTRAVENOUS | Status: AC
  Filled 2023-09-02: qty 100

## 2023-09-02 MED ORDER — OXYCODONE HCL 5 MG PO TABS
5.0000 mg | ORAL_TABLET | ORAL | Status: DC | PRN
  Administered 2023-09-02 – 2023-09-04 (×6): 10 mg via ORAL
  Filled 2023-09-02 (×6): qty 2

## 2023-09-02 MED ORDER — DIPHENHYDRAMINE HCL 25 MG PO CAPS: 25.0000 mg | ORAL_CAPSULE | ORAL | Status: DC | PRN

## 2023-09-02 MED ORDER — ACETAMINOPHEN 10 MG/ML IV SOLN
INTRAVENOUS | Status: AC
  Filled 2023-09-02: qty 100

## 2023-09-02 MED ORDER — OXYTOCIN-SODIUM CHLORIDE 30-0.9 UT/500ML-% IV SOLN: 2.5000 [IU]/h | INTRAVENOUS | Status: AC

## 2023-09-02 MED ORDER — SOD CITRATE-CITRIC ACID 500-334 MG/5ML PO SOLN: 30.0000 mL | ORAL | Status: DC

## 2023-09-02 MED ORDER — PHENYLEPHRINE 80 MCG/ML (10ML) SYRINGE FOR IV PUSH (FOR BLOOD PRESSURE SUPPORT)
PREFILLED_SYRINGE | INTRAVENOUS | Status: AC
Start: 2023-09-02 — End: 2023-09-02
  Filled 2023-09-02: qty 10

## 2023-09-02 MED ORDER — ONDANSETRON HCL 4 MG/2ML IJ SOLN
INTRAMUSCULAR | Status: AC
  Filled 2023-09-02: qty 2

## 2023-09-02 MED ORDER — SIMETHICONE 80 MG PO CHEW
80.0000 mg | CHEWABLE_TABLET | ORAL | Status: DC | PRN
Start: 2023-09-02 — End: 2023-09-04

## 2023-09-02 MED ORDER — PHENYLEPHRINE HCL-NACL 20-0.9 MG/250ML-% IV SOLN
INTRAVENOUS | Status: AC
  Filled 2023-09-02: qty 250

## 2023-09-02 MED ORDER — NALOXONE HCL 0.4 MG/ML IJ SOLN: 0.4000 mg | INTRAMUSCULAR | Status: DC | PRN

## 2023-09-02 MED ORDER — KETOROLAC TROMETHAMINE 30 MG/ML IJ SOLN
INTRAMUSCULAR | Status: AC
  Filled 2023-09-02: qty 1

## 2023-09-02 MED ORDER — SOD CITRATE-CITRIC ACID 500-334 MG/5ML PO SOLN
30.0000 mL | ORAL | Status: AC
  Administered 2023-09-02: 30 mL via ORAL

## 2023-09-02 MED ORDER — OXYCODONE HCL 5 MG/5ML PO SOLN: 5.0000 mg | Freq: Once | ORAL | Status: DC | PRN

## 2023-09-02 MED ORDER — KETOROLAC TROMETHAMINE 30 MG/ML IJ SOLN
30.0000 mg | Freq: Once | INTRAMUSCULAR | Status: AC | PRN
  Administered 2023-09-02: 30 mg via INTRAVENOUS

## 2023-09-02 MED ORDER — DEXMEDETOMIDINE HCL IN NACL 80 MCG/20ML IV SOLN
INTRAVENOUS | Status: AC
  Filled 2023-09-02: qty 20

## 2023-09-02 MED ORDER — SODIUM CHLORIDE 0.9 % IR SOLN
Status: DC | PRN
  Administered 2023-09-02: 200 mL

## 2023-09-02 MED ORDER — SIMETHICONE 80 MG PO CHEW
80.0000 mg | CHEWABLE_TABLET | Freq: Three times a day (TID) | ORAL | Status: DC
  Administered 2023-09-02 – 2023-09-04 (×7): 80 mg via ORAL
  Filled 2023-09-02 (×7): qty 1

## 2023-09-02 MED ORDER — BUPIVACAINE IN DEXTROSE 0.75-8.25 % IT SOLN
INTRATHECAL | Status: DC | PRN
  Administered 2023-09-02: 1.6 mL via INTRATHECAL

## 2023-09-02 MED ORDER — OXYCODONE HCL 5 MG PO TABS: 5.0000 mg | ORAL_TABLET | Freq: Once | ORAL | Status: DC | PRN

## 2023-09-02 MED ORDER — MORPHINE SULFATE (PF) 0.5 MG/ML IJ SOLN
INTRAMUSCULAR | Status: DC | PRN
  Administered 2023-09-02: 150 ug via INTRATHECAL

## 2023-09-02 MED ORDER — KETOROLAC TROMETHAMINE 30 MG/ML IJ SOLN
30.0000 mg | Freq: Four times a day (QID) | INTRAMUSCULAR | Status: AC
  Administered 2023-09-02 – 2023-09-03 (×4): 30 mg via INTRAVENOUS
  Filled 2023-09-02 (×4): qty 1

## 2023-09-02 MED ORDER — MEPERIDINE HCL 25 MG/ML IJ SOLN: 6.2500 mg | INTRAMUSCULAR | Status: DC | PRN

## 2023-09-02 MED ORDER — SODIUM CHLORIDE 0.9 % IV SOLN: 12.5000 mg | INTRAVENOUS | Status: DC | PRN

## 2023-09-02 MED ORDER — DEXAMETHASONE SODIUM PHOSPHATE 4 MG/ML IJ SOLN
INTRAMUSCULAR | Status: AC
  Filled 2023-09-02: qty 2

## 2023-09-02 MED ORDER — ONDANSETRON HCL 4 MG/2ML IJ SOLN
INTRAMUSCULAR | Status: DC | PRN
  Administered 2023-09-02: 4 mg via INTRAVENOUS

## 2023-09-02 MED ORDER — ACETAMINOPHEN 500 MG PO TABS
1000.0000 mg | ORAL_TABLET | Freq: Four times a day (QID) | ORAL | Status: DC
  Administered 2023-09-02 – 2023-09-04 (×8): 1000 mg via ORAL
  Filled 2023-09-02 (×8): qty 2

## 2023-09-02 MED ORDER — DIPHENHYDRAMINE HCL 50 MG/ML IJ SOLN: 12.5000 mg | INTRAMUSCULAR | Status: DC | PRN

## 2023-09-02 MED ORDER — NALOXONE HCL 4 MG/10ML IJ SOLN: 1.0000 ug/kg/h | INTRAVENOUS | Status: DC | PRN

## 2023-09-02 MED ORDER — PHENYLEPHRINE HCL-NACL 20-0.9 MG/250ML-% IV SOLN
INTRAVENOUS | Status: DC | PRN
  Administered 2023-09-02: 60 ug/min via INTRAVENOUS

## 2023-09-02 MED ORDER — MENTHOL 3 MG MT LOZG: 1.0000 | LOZENGE | OROMUCOSAL | Status: DC | PRN

## 2023-09-02 MED ORDER — HYDROMORPHONE HCL 1 MG/ML IJ SOLN: 0.2500 mg | INTRAMUSCULAR | Status: DC | PRN

## 2023-09-02 MED ORDER — PHENYLEPHRINE HCL (PRESSORS) 10 MG/ML IV SOLN
INTRAVENOUS | Status: DC | PRN
  Administered 2023-09-02: 240 ug via INTRAVENOUS

## 2023-09-02 MED ORDER — CEFAZOLIN SODIUM-DEXTROSE 2-4 GM/100ML-% IV SOLN
2.0000 g | INTRAVENOUS | Status: AC
  Administered 2023-09-02: 2 g via INTRAVENOUS

## 2023-09-02 MED ORDER — ZOLPIDEM TARTRATE 5 MG PO TABS: 5.0000 mg | ORAL_TABLET | Freq: Every evening | ORAL | Status: DC | PRN

## 2023-09-02 SURGICAL SUPPLY — 32 items
BENZOIN TINCTURE PRP APPL 2/3 (GAUZE/BANDAGES/DRESSINGS) ×1 IMPLANT
CHLORAPREP W/TINT 26 (MISCELLANEOUS) ×2 IMPLANT
CLAMP UMBILICAL CORD (MISCELLANEOUS) ×1 IMPLANT
CLOTH BEACON ORANGE TIMEOUT ST (SAFETY) ×1 IMPLANT
DRSG OPSITE POSTOP 4X10 (GAUZE/BANDAGES/DRESSINGS) ×1 IMPLANT
ELECTRODE REM PT RTRN 9FT ADLT (ELECTROSURGICAL) ×1 IMPLANT
EXCISOR BIOPSY CONE FISHER (MISCELLANEOUS) IMPLANT
EXTRACTOR VACUUM M CUP 4 TUBE (SUCTIONS) IMPLANT
GAUZE SPONGE 4X4 12PLY STRL LF (GAUZE/BANDAGES/DRESSINGS) IMPLANT
GLOVE BIO SURGEON STRL SZ7.5 (GLOVE) ×1 IMPLANT
GLOVE BIOGEL PI IND STRL 7.0 (GLOVE) ×1 IMPLANT
GLOVE BIOGEL PI IND STRL 7.5 (GLOVE) ×1 IMPLANT
GOWN STRL REUS W/TWL LRG LVL3 (GOWN DISPOSABLE) ×2 IMPLANT
KIT ABG SYR 3ML LUER SLIP (SYRINGE) IMPLANT
MAT PREVALON FULL STRYKER (MISCELLANEOUS) IMPLANT
NDL HYPO 25X5/8 SAFETYGLIDE (NEEDLE) IMPLANT
NEEDLE HYPO 25X5/8 SAFETYGLIDE (NEEDLE) IMPLANT
NS IRRIG 1000ML POUR BTL (IV SOLUTION) ×1 IMPLANT
PACK C SECTION WH (CUSTOM PROCEDURE TRAY) ×1 IMPLANT
PAD OB MATERNITY 4.3X12.25 (PERSONAL CARE ITEMS) ×1 IMPLANT
RTRCTR C-SECT PINK 25CM LRG (MISCELLANEOUS) ×1 IMPLANT
STRIP CLOSURE SKIN 1/2X4 (GAUZE/BANDAGES/DRESSINGS) ×1 IMPLANT
SUT CHROMIC 2 0 CT 1 (SUTURE) ×1 IMPLANT
SUT MNCRL 0 VIOLET CTX 36 (SUTURE) ×1 IMPLANT
SUT MNCRL AB 3-0 PS2 27 (SUTURE) ×1 IMPLANT
SUT PLAIN 2 0 XLH (SUTURE) ×1 IMPLANT
SUT VIC AB 0 CT1 36 (SUTURE) ×1 IMPLANT
SUT VIC AB 0 CTX36XBRD ANBCTRL (SUTURE) ×3 IMPLANT
SUT VIC AB 2-0 SH 27XBRD (SUTURE) IMPLANT
TOWEL OR 17X24 6PK STRL BLUE (TOWEL DISPOSABLE) ×1 IMPLANT
TRAY FOLEY W/BAG SLVR 14FR LF (SET/KITS/TRAYS/PACK) ×1 IMPLANT
WATER STERILE IRR 1000ML POUR (IV SOLUTION) ×1 IMPLANT

## 2023-09-02 NOTE — Op Note (Addendum)
 Cesarean Section Procedure Note  Indications: 23yo G2P1001 at 72 1/7wks with h/o c-section presenting for repeat c-section.  Pre-operative Diagnosis: 1.39 1/7wks 2.Repeat Cesarean Section   Post-operative Diagnosis: 1.39 1/7 2.Repeat Cesarean Section  Procedure: Repeat Low Transverse Cesarean Section   PR OB ANTEPARTUM CARE CESAREAN DLVR & POSTPARTUM [[id number]]  Surgeon: Henry Slough, MD    Assistants: Albino, surgical tech  Anesthesia: Spinal  Anesthesiologist: Cleotilde Butler Dade, MD   Procedure Details  The patient was taken to the operating room secondary to h/o c-section after the risks, benefits, complications, treatment options, and expected outcomes were discussed with the patient.  The patient concurred with the proposed plan, giving informed consent which was signed and witnessed. The patient was taken to Operating Room B, identified as Ana Lloyd and the procedure verified as C-Section Delivery. A Time Out was held and the above information confirmed.  After induction of anesthesia by obtaining a spinal, the patient was prepped and draped in the usual sterile manner. A Pfannenstiel skin incision was made and carried down through the subcutaneous tissue to the underlying layer of fascia.  The fascia was incised bilaterally and extended transversely bilaterally with the Mayo scissors. Kocher clamps were placed on the inferior aspect of the fascial incision and the underlying rectus muscle was separated from the fascia. The same was done on the superior aspect of the fascial incision.  The peritoneum was identified, entered bluntly and extended manually.  Omental adhesions noted on the left.  An Alexis self-retaining retractor was placed.  The utero-vesical peritoneal reflection was incised transversely and the bladder flap was bluntly freed from the lower uterine segment. A low transverse uterine incision was made with the scalpel and extended bilaterally with the bandage  scissors.  The infant was delivered in vertex position without difficulty with a body and nuchal cord.  After the umbilical cord was clamped and cut, the infant was handed to the awaiting pediatricians.  Cord blood was obtained for evaluation.  The placenta was removed intact and appeared to be within normal limits. The uterus was cleared of all clots and debris. The uterine incision was closed with running interlocking sutures of 0 Vicryl and a second imbricating layer was performed as well.   Bilateral tubes and ovaries appeared to be within normal limits.  Good hemostasis was noted.  Copious irrigation was performed until clear.  The peritoneum was repaired with 2-0 chromic via a running suture.  The fascia was reapproximated with a running suture of 0 Vicryl. The subcutaneous tissue was reapproximated with 3 interrupted sutures of 2-0 plain.  The skin was reapproximated with a subcuticular suture of 3-0 monocryl.  Steristrips were applied.  Instrument, sponge, and needle counts were correct prior to abdominal closure and at the conclusion of the case.  The patient was awaiting transfer to the recovery room in good condition.  Findings: Live female infant with Apgars 9 at one minute and 9 at five minutes.  Normal appearing bilateral ovaries and fallopian tubes were noted.  Estimated Blood Loss:  372 ml         Drains: 600 cc         Total IV Fluids: 2200 ml         Specimens to Pathology: Placenta         Complications:  None; patient tolerated the procedure well.         Disposition: PACU - hemodynamically stable.         Condition: stable  Attending Attestation: I performed the procedure.  I was present and scrubbed and the assistant was required due to complexity of anatomy.

## 2023-09-02 NOTE — Transfer of Care (Signed)
 Immediate Anesthesia Transfer of Care Note  Patient: Ana Lloyd  Procedure(s) Performed: CESAREAN DELIVERY (Abdomen)  Patient Location: PACU  Anesthesia Type:Spinal  Level of Consciousness: awake, alert , and oriented  Airway & Oxygen Therapy: Patient Spontanous Breathing  Post-op Assessment: Report given to RN and Post -op Vital signs reviewed and stable  Post vital signs: Reviewed and stable  Last Vitals:  Vitals Value Taken Time  BP    Temp    Pulse    Resp    SpO2      Last Pain:  Vitals:   09/02/23 0822  TempSrc: Oral  PainSc: 0-No pain         Complications: No notable events documented.

## 2023-09-02 NOTE — Anesthesia Postprocedure Evaluation (Signed)
 Anesthesia Post Note  Patient: Ana Lloyd  Procedure(s) Performed: CESAREAN DELIVERY (Abdomen)     Patient location during evaluation: PACU Anesthesia Type: Spinal Level of consciousness: awake and alert Pain management: pain level controlled Vital Signs Assessment: post-procedure vital signs reviewed and stable Respiratory status: spontaneous breathing, nonlabored ventilation and respiratory function stable Cardiovascular status: blood pressure returned to baseline and stable Postop Assessment: no apparent nausea or vomiting Anesthetic complications: no   No notable events documented.  Last Vitals:  Vitals:   09/02/23 1300 09/02/23 1320  BP: 111/60 109/68  Pulse: 78 65  Resp: 20 18  Temp:  36.8 C  SpO2: 92% 97%    Last Pain:  Vitals:   09/02/23 1320  TempSrc: Oral  PainSc:    Pain Goal:    LLE Motor Response: Purposeful movement (09/02/23 1300) LLE Sensation: Tingling (09/02/23 1300) RLE Motor Response: Purposeful movement (09/02/23 1300) RLE Sensation: Tingling (09/02/23 1300)     Epidural/Spinal Function Cutaneous sensation: Tingles (09/02/23 1320), Patient able to flex knees: Yes (09/02/23 1320), Patient able to lift hips off bed: Yes (09/02/23 1320), Back pain beyond tenderness at insertion site: No (09/02/23 1320), Progressively worsening motor and/or sensory loss: No (09/02/23 1320), Bowel and/or bladder incontinence post epidural: No (09/02/23 1320)  Butler Levander Pinal

## 2023-09-02 NOTE — Anesthesia Preprocedure Evaluation (Signed)
 Anesthesia Evaluation  Patient identified by MRN, date of birth, ID band Patient awake    Reviewed: Allergy & Precautions, NPO status , Patient's Chart, lab work & pertinent test results  Airway Mallampati: III       Dental  (+) Teeth Intact   Pulmonary former smoker   Pulmonary exam normal        Cardiovascular Normal cardiovascular exam Rhythm:Regular Rate:Bradycardia     Neuro/Psych   Anxiety Depression       GI/Hepatic ,GERD  ,,  Endo/Other    Renal/GU      Musculoskeletal   Abdominal  (+) + obese  Peds  Hematology   Anesthesia Other Findings   Reproductive/Obstetrics (+) Pregnancy                              Anesthesia Physical Anesthesia Plan  ASA: II  Anesthesia Plan: Spinal   Post-op Pain Management:    Induction:   PONV Risk Score and Plan: 2 and Treatment may vary due to age or medical condition  Airway Management Planned: Natural Airway and Nasal Cannula  Additional Equipment: None  Intra-op Plan:   Post-operative Plan:   Informed Consent:      Only emergency history available  Plan Discussed with: CRNA  Anesthesia Plan Comments:          Anesthesia Quick Evaluation

## 2023-09-02 NOTE — Anesthesia Procedure Notes (Signed)
 Spinal  Patient location during procedure: OB Start time: 09/02/2023 9:52 AM End time: 09/02/2023 9:57 AM Reason for block: surgical anesthesia Staffing Performed: anesthesiologist  Anesthesiologist: Cleotilde Butler Dade, MD Performed by: Cleotilde Butler Dade, MD Authorized by: Cleotilde Butler Dade, MD   Preanesthetic Checklist Completed: patient identified, IV checked, risks and benefits discussed, surgical consent, monitors and equipment checked, pre-op evaluation and timeout performed Spinal Block Patient position: sitting Prep: DuraPrep and site prepped and draped Patient monitoring: heart rate, cardiac monitor, continuous pulse ox and blood pressure Approach: midline Location: L3-4 Injection technique: single-shot Needle Needle type: Pencan  Needle gauge: 24 G Needle length: 10 cm Assessment Sensory level: T4 Events: CSF return

## 2023-09-02 NOTE — H&P (Signed)
 Ana Lloyd is a 23 y.o. female presenting for scheduled repeat c-section.  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 Social History:  reports that she has quit smoking. Her smoking use included cigarettes. She has never used smokeless tobacco. She reports that she does not currently use alcohol. She reports current drug use. Drug: Marijuana.     Maternal Diabetes: No Genetic Screening: Declined Maternal Ultrasounds/Referrals: Normal Fetal Ultrasounds or other Referrals:  None Maternal Substance Abuse:  No Significant Maternal Medications:  None Significant Maternal Lab Results:  None Number of Prenatal Visits:greater than 3 verified prenatal visits but last visit was at 30wks Maternal Vaccinations:None documented Other Comments:  None  Review of Systems No F/C/N/V/D  History   Blood pressure 115/81, pulse (!) 104, temperature 98.2 F (36.8 C), temperature source Oral, resp. rate 17, height 5' 0.2 (1.529 m), weight 87.2 kg, last menstrual period 11/24/2022, SpO2 97%, not currently breastfeeding. Exam Physical Exam  Lungs unlabored breathing CV RRR Abdomen gravid, NT Extremities no calf tenderness  FHR see  flowsheet  Prenatal labs: ABO, Rh: --/--/AB POS (07/24 0906) Antibody: NEG (07/24 0906) Rubella: Immune (03/20 0000) RPR: NON REACTIVE (07/24 0902)  HBsAg: Negative (03/20 0000)  HIV: Non-reactive (03/20 0000)  GBS:   Pending  Assessment/Plan: 23yo G2P1001 at 39 1/7wks presenting for scheduled repeat c-section.  She has not been seen in the office since 30wks.  Will collect GBS.  Risks benefits alternatives reviewed including but not limited to bleeding infection and injury.     Ana Lloyd 09/02/2023, 8:44 AM

## 2023-09-03 LAB — CBC
HCT: 27.8 % — ABNORMAL LOW (ref 36.0–46.0)
Hemoglobin: 9.6 g/dL — ABNORMAL LOW (ref 12.0–15.0)
MCH: 31.2 pg (ref 26.0–34.0)
MCHC: 34.5 g/dL (ref 30.0–36.0)
MCV: 90.3 fL (ref 80.0–100.0)
Platelets: 277 K/uL (ref 150–400)
RBC: 3.08 MIL/uL — ABNORMAL LOW (ref 3.87–5.11)
RDW: 14.4 % (ref 11.5–15.5)
WBC: 23.9 K/uL — ABNORMAL HIGH (ref 4.0–10.5)
nRBC: 0 % (ref 0.0–0.2)

## 2023-09-03 MED ORDER — FERROUS SULFATE 325 (65 FE) MG PO TABS
325.0000 mg | ORAL_TABLET | ORAL | Status: DC
  Administered 2023-09-03: 325 mg via ORAL
  Filled 2023-09-03 (×2): qty 1

## 2023-09-03 NOTE — Progress Notes (Addendum)
 Post Partum Day 1 s/p C-Section Subjective: no complaints, up ad lib, voiding, tolerating PO, and + flatus  Objective: Blood pressure (!) 103/59, pulse 72, temperature 98.4 F (36.9 C), temperature source Oral, resp. rate 16, height 5' 0.2 (1.529 m), weight 87.2 kg, last menstrual period 11/24/2022, SpO2 97%, unknown if currently breastfeeding.  Physical Exam:  General: alert, cooperative, and no distress Lochia: appropriate Uterine Fundus: FF, NT Incision: dressing intact DVT Evaluation: no calf tenderness  Recent Labs    09/03/23 0521  HGB 9.6*  HCT 27.8*    Assessment/Plan: Circumcision done today Iron every other day for aymptomatic anemia Routine post op/pp care   LOS: 1 day   [name], MD 09/03/2023, 11:53 AM

## 2023-09-03 NOTE — Clinical Social Work Maternal (Signed)
 CLINICAL SOCIAL WORK MATERNAL/CHILD NOTE  Patient Details  Name: Ana Lloyd MRN: [id number] Date of Birth: 07-07-2000  Date:  09/03/2023  Clinical Social Worker Initiating Note:  Sharyne Roulette, LCSWA Date/Time: Initiated:  09/03/23/1746     Child's Name:  Ana Lloyd   Biological Parents:  Mother, Father (FOB: Ana Lloyd, DOB: 08/14/1996)   Need for Interpreter:  None   Reason for Referral:  Current Substance Use/Substance Use During Pregnancy  , Behavioral Health Concerns   Address:  [address] [place], [place] [address]    Phone number:  [phone] (home)     Additional phone number:   Household Members/Support Persons (HM/SP):   Household Member/Support Person 1, Household Member/Support Person 2   HM/SP Name Relationship DOB or Age  HM/SP -1 Ana Lloyd FOB 08/14/1996  HM/SP -2 Ana Lloyd Son 04/03/2019  HM/SP -3        HM/SP -4        HM/SP -5        HM/SP -6        HM/SP -7        HM/SP -8          Natural Supports (not living in the home):  Immediate Family, Extended Family   Professional Supports: None   Employment: Armed forces operational officer, Consulting civil engineer   Type of Work: Production assistant, 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 [employer]:  Attending college   Homebound arranged:    Surveyor, quantity Resources:  Medicaid   Other Resources:  Sales executive  , [facility]   Cultural/Religious Considerations Which May Impact Care:    Strengths:  Ability to meet basic needs  , Merchandiser, retail, Home prepared for child     Psychotropic Medications:         Pediatrician:    [facility] area  Pediatrician List:   [facility]  [place]    [place]    [place] [facility]      Pediatrician Fax Number:    Risk Factors/Current Problems:  Substance Use  , Mental Health Concerns     Cognitive State:  Linear Thinking  , Able to Concentrate  , Alert  , Goal Oriented     Mood/Affect:  Calm  , Comfortable   , Interested  , Relaxed     CSW Assessment: CSW was consulted due to marijuana use during pregnancy and history of depression. CSW met with MOB at bedside to complete assessment. When CSW entered room, MOB was observed laying in hospital bed with infant asleep on his back beside her. FOB and their other son, Ana were present sitting nearby. CSW introduced self and offered privacy. MOB provided verbal consent for CSW to complete consult with FOB and her son present. CSW explained reason for consult. MOB presented as calm, was agreeable to consult and remained engaged throughout encounter.   CSW inquired about demographic information in chart. MOB reports the address in chart is her mother's address and she resides at [address] [place], [place] [address] with FOB and their son, Ana Lloyd (DOB: 04/03/2019). CSW inquired about supports. MOB identified her mother and sister as supports.   CSW inquired about MOB's mental health history. MOB acknowledged a history of depression, stating she was first diagnosed in 10th grade. CSW inquired about mental health symptoms during pregnancy. MOB reports she did not endorse depression symptoms during pregnancy but every now and then felt  sad due to her best friend passing away last year. CSW expressed condolences. CSW inquired about mental health treatment. MOB reports she is not prescribed or taking mental health medication but meets with a therapist virtually (MOB was unable to recall the name of the therapy platform) occasionally to cope with the death of her best friend. CSW inquired if MOB experienced symptoms of postpartum depression after the birth of her older child. MOB denied a history of postpartum depression/anxiety. CSW assessed for safety. MOB denied current SI/HI.  CSW provided education regarding the baby blues period vs. perinatal mood disorders, discussed treatment and gave resources for mental health follow up if concerns arise.  CSW recommends  self-evaluation during the postpartum time period using the New Mom Checklist from Postpartum Progress and encouraged MOB to contact a medical professional if symptoms are noted at any time.    MOB reports she has all needed items for infant, including a car seat and bassinet. MOB denied additional resource needs.  CSW informed MOB about hospital drug screen policy due to [facility] use during pregnancy. CSW explained that infant's UDS resulted positive for THC and a CPS report will be made. CSW explained that infant's CDS will continue to be monitored. MOB expressed understanding. CSW inquired about substance use during pregnancy.  MOB reports she smoked marijuana throughout her pregnancy, reporting her last use was before presenting to the hospital. MOB reports she smoked marijuana to help with her depression and due to nausea. MOB denied using other illicit substances during pregnancy. CSW inquired about prior CPS involvement. MOB reports a CPS case was opened after the birth of her first child in 2021 due to her smoking marijuana during pregnancy. MOB states CPS completed a home visit and closed the case shortly after.   CSW provided review of Sudden Infant Death Syndrome (SIDS) precautions.    CSW placed call to [facility] Department of Social Services CPS line and made CPS report to social worker Rosina Croak due to infant's UDS resulting positive for THC.  CSW identifies no further need for intervention and no barriers to discharge at this time.  CSW Plan/Description:  No Further Intervention Required/No Barriers to Discharge, Sudden Infant Death Syndrome (SIDS) Education, Hospital Drug Screen Policy Information, Perinatal Mood and Anxiety Disorder (PMADs) Education, Child Protective Service Report  , CSW Will Continue to Monitor Umbilical Cord Tissue Drug Screen Results and Make Report if Warranted    Keasha Malkiewicz K Seneca Hoback, LCSWA 09/03/2023, 5:49 PM

## 2023-09-04 ENCOUNTER — Encounter (HOSPITAL_COMMUNITY): Payer: Self-pay | Admitting: Obstetrics and Gynecology

## 2023-09-04 MED ORDER — IBUPROFEN 600 MG PO TABS: 600.0000 mg | ORAL_TABLET | Freq: Four times a day (QID) | ORAL | 0 refills | Status: AC

## 2023-09-04 MED ORDER — FERROUS SULFATE 325 (65 FE) MG PO TABS: 325.0000 mg | ORAL_TABLET | ORAL | 0 refills | Status: AC

## 2023-09-04 MED ORDER — OXYCODONE HCL 5 MG PO TABS: 5.0000 mg | ORAL_TABLET | Freq: Four times a day (QID) | ORAL | 0 refills | Status: DC | PRN

## 2023-09-04 MED ORDER — ACETAMINOPHEN 500 MG PO TABS: 1000.0000 mg | ORAL_TABLET | Freq: Four times a day (QID) | ORAL | Status: AC

## 2023-09-04 NOTE — Discharge Summary (Signed)
 Postpartum Discharge Summary  Date of Service updated 09/04/23     Patient Name: Ana Lloyd DOB: 10/04/2000 MRN: [id number]  Date of admission: 09/02/2023 Delivery date:09/02/2023 Delivering provider: HENRY SLOUGH Date of discharge: 09/04/2023  Admitting diagnosis: Other specified postprocedural states [Z98.890] History of cesarean section [Z98.891] S/P repeat low transverse C-section [S01.108] Intrauterine pregnancy: [redacted]w[redacted]d     Secondary diagnosis:  Principal Problem:   Postpartum care following cesarean delivery Active Problems:   Other specified postprocedural states   History of cesarean section   S/P repeat low transverse C-section  Additional problems: none    Discharge diagnosis: Term Pregnancy Delivered                                              Post partum procedures:none Augmentation: N/A Complications: None  Hospital course: Scheduled C/S   23 y.o. yo G2P2002 at [redacted]w[redacted]d was admitted to the hospital 09/02/2023 for scheduled cesarean section with the following indication:Elective Repeat.Delivery details are as follows:  Membrane Rupture Time/Date: 10:35 AM,09/02/2023  Delivery Method:C-Section, Low Transverse Operative Delivery:N/A Details of operation can be found in separate operative note.  Patient had a postpartum course was uncomplicated.  She is ambulating, tolerating a regular diet, passing flatus, and urinating well. Patient is discharged home in stable condition on  09/04/23        Newborn Data: Birth date:09/02/2023 Birth time:10:35 AM Gender:Female Living status:Living Apgars:9 ,9  Weight:3400 g    Magnesium  Sulfate received: No BMZ received: No Rhophylac:N/A MMR:N/A RSV Vaccine received: No Transfusion:No Immunizations administered: Immunization History  Administered Date(s) Administered   Influenza,inj,Quad PF,6+ Mos 11/20/2015, 04/04/2019   Tdap 01/09/2019    Physical exam  Vitals:   09/03/23 0623 09/03/23 1404 09/03/23 2042  09/04/23 0500  BP: (!) 103/59 114/74 128/71 121/74  Pulse: 72 89 86 75  Resp:  16 18 17   Temp: 98.4 F (36.9 C) 98.4 F (36.9 C) 98.3 F (36.8 C)   TempSrc: Oral Oral Oral Oral  SpO2: 97% 100% 100% 100%  Weight:      Height:       General: alert, cooperative, and no distress Lochia: appropriate Uterine Fundus: firm Incision: Dressing is clean, dry, and intact DVT Evaluation: No evidence of DVT seen on physical exam. No cords or calf tenderness. No significant calf/ankle edema. Labs: Lab Results  Component Value Date   WBC 23.9 (H) 09/03/2023   HGB 9.6 (L) 09/03/2023   HCT 27.8 (L) 09/03/2023   MCV 90.3 09/03/2023   PLT 277 09/03/2023      Latest Ref Rng & Units 02/01/2022    1:10 PM  CMP  Glucose 70 - 99 mg/dL 99   BUN 6 - 20 mg/dL 10   Creatinine 9.55 - 1.00 mg/dL 9.40   Sodium 864 - 854 mmol/L 139   Potassium 3.5 - 5.1 mmol/L 4.2   Chloride 98 - 111 mmol/L 107   CO2 22 - 32 mmol/L 26   Calcium 8.9 - 10.3 mg/dL 8.7   Total Protein 6.5 - 8.1 g/dL 6.7   Total Bilirubin 0.3 - 1.2 mg/dL 0.4   Alkaline Phos 38 - 126 U/L 48   AST 15 - 41 U/L 9   ALT 0 - 44 U/L 6    Edinburgh Score:    09/02/2023    6:15 PM  Edinburgh Postnatal Depression Scale  Screening Tool  I have been able to laugh and see the funny side of things. 0  I have looked forward with enjoyment to things. 0  I have blamed myself unnecessarily when things went wrong. 1  I have been anxious or worried for no good reason. 1  I have felt scared or panicky for no good reason. 0  Things have been getting on top of me. 1  I have been so unhappy that I have had difficulty sleeping. 0  I have felt sad or miserable. 0  I have been so unhappy that I have been crying. 1  The thought of harming myself has occurred to me. 0  Edinburgh Postnatal Depression Scale Total 4      After visit meds:  Allergies as of 09/04/2023   No Known Allergies      Medication List     TAKE these medications     acetaminophen  500 MG tablet Commonly known as: TYLENOL  Take 2 tablets (1,000 mg total) by mouth every 6 (six) hours.   ferrous sulfate  325 (65 FE) MG tablet Take 1 tablet (325 mg total) by mouth every other day. Start taking on: September 05, 2023   ibuprofen  600 MG tablet Commonly known as: ADVIL  Take 1 tablet (600 mg total) by mouth every 6 (six) hours.   oxyCODONE  5 MG immediate release tablet Commonly known as: Oxy IR/ROXICODONE  Take 1 tablet (5 mg total) by mouth every 6 (six) hours as needed for up to 4 days for moderate pain (pain score 4-6), severe pain (pain score 7-10) or breakthrough pain.               Discharge Care Instructions  (From admission, onward)           Start     Ordered   09/04/23 0000  Discharge wound care:       Comments: Take dressing off on 09/07/23, remove it sooner if it is dirty or damaged. Clean area with soap and water  and pat dry. You can leave the steri strips on until they fall off or take them off gently by 09/09/23. Call the office for increased drainage, redness, pain, or warmth. Keep the incision area clean and dry at all times.   09/04/23 1219             Discharge home in stable condition Infant Feeding: formula Infant Disposition:home with mother Discharge instruction: per After Visit Summary and Postpartum booklet. Activity: Advance as tolerated. Pelvic rest for 6 weeks.  Diet: low salt diet Anticipated Birth Control: Unsure and aware of options and will decide at 6-week visit Postpartum Appointment:6 weeks Additional Postpartum F/U: none Future Appointments:No future appointments. Follow up Visit:  Follow-up Information     Henry Slough, MD. Schedule an appointment as soon as possible for a visit in 6 week(s).   Specialty: Obstetrics and Gynecology Contact information: [address] [place] [address] [phone]                     09/04/2023 Mercer KATHEE Peal, CNM

## 2023-09-06 LAB — SURGICAL PATHOLOGY

## 2023-09-08 ENCOUNTER — Inpatient Hospital Stay (HOSPITAL_COMMUNITY)

## 2023-09-08 ENCOUNTER — Encounter (HOSPITAL_COMMUNITY): Payer: Self-pay | Admitting: Obstetrics and Gynecology

## 2023-09-08 ENCOUNTER — Inpatient Hospital Stay (HOSPITAL_COMMUNITY)
Admission: AD | Admit: 2023-09-08 | Discharge: 2023-09-09 | Disposition: A | Discharge status: Home / Self Care | Attending: Obstetrics and Gynecology | Admitting: Obstetrics and Gynecology

## 2023-09-08 DIAGNOSIS — O9089 Other complications of the puerperium, not elsewhere classified: Secondary | ICD-10-CM | POA: Insufficient documentation

## 2023-09-08 DIAGNOSIS — Z4889 Encounter for other specified surgical aftercare: Secondary | ICD-10-CM | POA: Diagnosis not present

## 2023-09-08 DIAGNOSIS — O900 Disruption of cesarean delivery wound: Secondary | ICD-10-CM | POA: Diagnosis present

## 2023-09-08 LAB — COMPREHENSIVE METABOLIC PANEL WITH GFR
ALT: 12 U/L (ref 0–44)
AST: 15 U/L (ref 15–41)
Albumin: 3.1 g/dL — ABNORMAL LOW (ref 3.5–5.0)
Alkaline Phosphatase: 95 U/L (ref 38–126)
Anion gap: 9 (ref 5–15)
BUN: 13 mg/dL (ref 6–20)
CO2: 23 mmol/L (ref 22–32)
Calcium: 9 mg/dL (ref 8.9–10.3)
Chloride: 106 mmol/L (ref 98–111)
Creatinine, Ser: 0.71 mg/dL (ref 0.44–1.00)
GFR, Estimated: >60 mL/min (ref >60)
Glucose, Bld: 98 mg/dL (ref 70–99)
Potassium: 4.3 mmol/L (ref 3.5–5.1)
Sodium: 138 mmol/L (ref 135–145)
Total Bilirubin: 0.6 mg/dL (ref 0.0–1.2)
Total Protein: 6.7 g/dL (ref 6.5–8.1)

## 2023-09-08 LAB — CBC
HCT: 33.7 % — ABNORMAL LOW (ref 36.0–46.0)
Hemoglobin: 11.1 g/dL — ABNORMAL LOW (ref 12.0–15.0)
MCH: 30.7 pg (ref 26.0–34.0)
MCHC: 32.9 g/dL (ref 30.0–36.0)
MCV: 93.1 fL (ref 80.0–100.0)
Platelets: 539 K/uL — ABNORMAL HIGH (ref 150–400)
RBC: 3.62 MIL/uL — ABNORMAL LOW (ref 3.87–5.11)
RDW: 14.3 % (ref 11.5–15.5)
WBC: 14.5 K/uL — ABNORMAL HIGH (ref 4.0–10.5)
nRBC: 0 % (ref 0.0–0.2)

## 2023-09-08 LAB — TYPE AND SCREEN
ABO/RH(D): AB POS
Antibody Screen: NEGATIVE

## 2023-09-08 NOTE — MAU Note (Signed)
 CT called and informed RN that they would send transport for patient once her CMP resulted.

## 2023-09-08 NOTE — MAU Note (Signed)
 Pt says she del by C/S on Sat 7-26 then was D/C on Monday 7-28.- all ok .  Then all ok until 7pm- tonight . She sitting in car - holding her son- blood was coming out of [facility]. And she came here.

## 2023-09-08 NOTE — MAU Provider Note (Signed)
 History     CSN: [id number]  Arrival date and time: 09/08/23 2019   Event Date/Time   First Provider Initiated Contact with Patient 09/08/23 2056      No chief complaint on file.   Ana Lloyd is a 23 y.o. G2P2002 at who receives care at [facility].  She presents today for incisional bleeding.  She states she was riding in the car and felt blood coming from her incision.  She states the blood saturated through her honeycomb and her undergarments and pants.  She states she is not having any pain and took her last dose of oxycodone  and ibuprofen  at 5pm.  She endorses vaginal bleeding, but feels it is normal. No issues with urination or constipation.   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   Insomnia 11/20/2015   Kidney infection    Kidney stone    x3    Obesity    Placental abruption 04/03/2019   Strep throat    Supervision of high risk pregnancy, antepartum 03/02/2023              NURSING     PROVIDER      Office Location    [facility]    Dating by    LMP c/w U/S at [redacted]w[redacted]d wks      [facility] Model    Traditional    Anatomy U/S           Initiated care at     [employer]     English                     LAB RESULTS       Support Person         Genetics    NIPS:   AFP:                 NT/IT (FT only)                     Carrier Screen    Horizon:       UTI (lower urinary tract infection)     Past Surgical History:  Procedure Laterality Date   ADENOIDECTOMY     CESAREAN SECTION N/A 04/03/2019   Procedure: CESAREAN SECTION;  Surgeon: Barbra Lang PARAS, DO;  Location: [facility] LD ORS;  Service: Obstetrics;  Laterality: N/A;   CESAREAN SECTION N/A 09/02/2023   Procedure: CESAREAN DELIVERY;  Surgeon: Henry Slough, MD;  Location: [facility] LD  ORS;  Service: Obstetrics;  Laterality: N/A;   TONSILLECTOMY      Family History  Problem Relation Age of Onset   Healthy Mother    Healthy Father    Diabetes Other    Hypertension Other     Social History   Tobacco Use   Smoking status: Former    Current packs/day:  0.50    Types: Cigarettes   Smokeless tobacco: Never   Tobacco comments:    none since +UPT  Vaping Use   Vaping status: Never Used  Substance Use Topics   Alcohol use: Not Currently    Comment: occasional   Drug use: Yes    Types: Marijuana    Comment: last smoked 08/13/2023    Allergies: No Known Allergies  Medications Prior to Admission  Medication Sig Dispense Refill Last Dose/Taking   acetaminophen  (TYLENOL ) 500 MG tablet Take 2 tablets (1,000 mg total) by mouth every 6 (six) hours.   09/08/2023 at  5:00 PM   ferrous sulfate  325 (65 FE) MG tablet Take 1 tablet (325 mg total) by mouth every other day. 15 tablet 0 09/07/2023   ibuprofen  (ADVIL ) 600 MG tablet Take 1 tablet (600 mg total) by mouth every 6 (six) hours. 30 tablet 0 09/08/2023 at  5:00 PM   oxyCODONE  (OXY IR/ROXICODONE ) 5 MG immediate release tablet Take 1 tablet (5 mg total) by mouth every 6 (six) hours as needed for up to 4 days for moderate pain (pain score 4-6), severe pain (pain score 7-10) or breakthrough pain. 12 tablet 0 09/08/2023 at  5:00 PM    Review of Systems  Constitutional:  Negative for chills and fever.  Gastrointestinal:  Positive for abdominal pain (Incisional, None currently). Negative for constipation, diarrhea, nausea and vomiting.  Genitourinary:  Positive for vaginal bleeding. Negative for difficulty urinating, dysuria and vaginal discharge.  Neurological:  Negative for dizziness, light-headedness and headaches.   Physical Exam   unknown if currently breastfeeding.  Physical Exam Vitals and nursing note reviewed. Exam conducted with a chaperone present Vivia, NT).  Constitutional:      General: She is not in acute  distress.    Appearance: Normal appearance.  HENT:     Head: Normocephalic and atraumatic.  Eyes:     General:        Right eye: No discharge.     Conjunctiva/sclera: Conjunctivae normal.  Cardiovascular:     Rate and Rhythm: Normal rate.  Pulmonary:     Effort: Pulmonary effort is normal. No respiratory distress.  Abdominal:     Palpations: Abdomen is soft.     Tenderness: There is no abdominal tenderness.     Comments: Honeycomb and steri strips saturated and removed. Incision well approximated. Suspected hematoma in symphysis pubis area. Moderate to large amt of blood from left side of incision when pressure applied above and below area. No erythema or apparent cellulitis. Incision is probed and extends ~6cm to patient right and no tracking to patient left. Fascia intact. Area irrigated with normal saline.   Musculoskeletal:        General: Normal range of motion.     Cervical back: Normal range of motion.  Skin:    General: Skin is warm and dry.     Findings: Bruising present.  Neurological:     Mental Status: She is alert and oriented to person, place, and time.  Psychiatric:        Mood and Affect: Mood normal.        Behavior: Behavior normal.     MAU Course  Procedures Results for orders placed or performed during the hospital encounter of 09/08/23 (from the past 24 hours)  CBC     Status: Abnormal   Collection Time: 09/08/23  9:42 PM  Result Value Ref Range   WBC 14.5 (H) 4.0 - 10.5 K/uL   RBC  3.62 (L) 3.87 - 5.11 MIL/uL   Hemoglobin 11.1 (L) 12.0 - 15.0 g/dL   HCT 66.2 (L) 63.9 - 53.9 %   MCV 93.1 80.0 - 100.0 fL   MCH 30.7 26.0 - 34.0 pg   MCHC 32.9 30.0 - 36.0 g/dL   RDW 85.6 88.4 - 84.4 %   Platelets 539 (H) 150 - 400 K/uL   nRBC 0.0 0.0 - 0.2 %  Type and screen [facility]     Status: None   Collection Time: 09/08/23  9:42 PM  Result Value Ref Range   ABO/RH(D) AB POS    Antibody Screen NEG    Sample Expiration       09/11/2023,2359 Performed at [facility], [address] [place], [place] [address]   Comprehensive metabolic panel     Status: Abnormal   Collection Time: 09/08/23  9:42 PM  Result Value Ref Range   Sodium 138 135 - 145 mmol/L   Potassium 4.3 3.5 - 5.1 mmol/L   Chloride 106 98 - 111 mmol/L   CO2 23 22 - 32 mmol/L   Glucose, Bld 98 70 - 99 mg/dL   BUN 13 6 - 20 mg/dL   Creatinine, Ser 9.28 0.44 - 1.00 mg/dL   Calcium 9.0 8.9 - 89.6 mg/dL   Total Protein 6.7 6.5 - 8.1 g/dL   Albumin 3.1 (L) 3.5 - 5.0 g/dL   AST 15 15 - 41 U/L   ALT 12 0 - 44 U/L   Alkaline Phosphatase 95 38 - 126 U/L   Total Bilirubin 0.6 0.0 - 1.2 mg/dL   GFR, Estimated >39 >39 mL/min   Anion gap 9 5 - 15   CT Angio Abd/Pel W and/or Wo Contrast Result Date: 09/09/2023 EXAM: CTA ABDOMEN AND PELVIS WITHOUT AND WITH CONTRAST 09/09/2023 01:00:00 AM TECHNIQUE: CTA images of the abdomen and pelvis without and with intravenous contrast. Three-dimensional MIP/volume rendered formations were performed. Automated exposure control, iterative reconstruction, and/or weight based adjustment of the mA/kV was utilized to reduce the radiation dose to as low as reasonably achievable. COMPARISON: None available. CLINICAL HISTORY: Retroperitoneal bleed suspected. Chief complaints; Drainage from Incision; CT Angio Abd/Pel W and/or Wo Contrast; Retroperitoneal bleed suspected.; 100 [id number] Contrast Given in 18G IV RAC; Without Incident. FINDINGS: VASCULATURE: AORTA: No acute finding. No abdominal aortic aneurysm. No dissection. CELIAC TRUNK: No acute finding. No occlusion or significant stenosis. SUPERIOR MESENTERIC ARTERY: No acute finding. No occlusion or significant stenosis. RENAL ARTERIES: No acute finding. No occlusion or significant stenosis. ILIAC ARTERIES: No acute finding. No occlusion or significant stenosis. LIVER: The liver is unremarkable. GALLBLADDER AND BILE DUCTS: Gallbladder is unremarkable. No biliary ductal dilatation.  SPLEEN: The spleen is unremarkable. PANCREAS: The pancreas is unremarkable. ADRENAL GLANDS: Bilateral adrenal glands demonstrate no acute abnormality. KIDNEYS, URETERS AND BLADDER: Geographic hypoattenuation in both kidneys compatible with pyelonephritis. No stones in the kidneys or ureters. No hydronephrosis. No perinephric or periureteral stranding. Urinary bladder is unremarkable. GI AND BOWEL: Stomach and duodenal sweep demonstrate no acute abnormality. There is no bowel obstruction. No abnormal bowel wall thickening or distension. REPRODUCTIVE: Enlarged edematous uterus and endometrial thickening compatible with recent postpartum state. PERITONEUM AND RETRPERITONEUM: No intraperitoneal or retroperitoneal fluid collection or hematoma. No free intraperitoneal air. LUNG BASE: No acute abnormality. LYMPH NODES: No lymphadenopathy. 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 Stranding and loosely organized fluid in the anterior lower abdomen is favored postoperative change due to recent  c-section. IMPRESSION: 1. Geographic hypoattenuation in both kidneys compatible with pyelonephritis. 2. No evidence of retroperitoneal bleed. 3. Enlarged edematous uterus and endometrial thickening compatible with recent postpartum state. 4. Stranding and free fluid in the low anterior abdomen favored due to postoperative change from c-section. Superimposed infection is not excluded by imaging. No abscess. Electronically signed by: Norman Gatlin MD 09/09/2023 01:28 AM EDT RP Workstation: [id number]    MDM Exam with wound care Labs: CBC, CMP, T&S Imaging Consult Prescription Assessment and Plan  23 year old S/P Repeat C/S Wound Care Suspected Hematoma   -Dr. KYM Furry  consulted and informed of patient status, evaluation, interventions, and results. Advised: *Obtain labs. *Send for CT to r/o need for surgical intervention.  -Dr. LOIS Bun consulted and informed of patient  status, evaluation, interventions, and results. Advised: *Place order for CTA abdomen/pelvis with contrast. *Also request inclusion of portal venous visualization as well.  -Orders placed  Harlene LITTIE Duncans 09/08/2023, 9:11 PM   Reassessment (12:20 AM) -Patient reports doing well. -Due to trauma patient pushed from CT queue. However, transport now in route.    Reassessment (1:40 AM) -CT returns without significant findings r/t incisional bleeding.  -Dr. KYM Furry updated on results and reviews imaging. Advised: *Okay to discharge home *Educated patient on anticipated leaking from area/site. *Consider antibiotics: Keflex  if breastfeeding, Bactrim  if bottlefeeding.  -Provider to bedside to discuss findings and follow up.  -Encouraged to follow up with primary office early next week. -Reviewed incision/wound care.  -Precautions reviewed. -Rx for Bactrim  sent to pharmacy on file.  -Encouraged to call primary office or return to MAU if symptoms worsen or with the onset of new symptoms. -Discharged to home in stable condition.  Harlene LITTIE Duncans MSN, CNM Advanced Practice Provider, [facility] [employer]

## 2023-09-09 ENCOUNTER — Inpatient Hospital Stay (HOSPITAL_COMMUNITY)

## 2023-09-09 DIAGNOSIS — N852 Hypertrophy of uterus: Secondary | ICD-10-CM | POA: Diagnosis not present

## 2023-09-09 DIAGNOSIS — R9389 Abnormal findings on diagnostic imaging of other specified body structures: Secondary | ICD-10-CM | POA: Diagnosis not present

## 2023-09-09 DIAGNOSIS — Z4889 Encounter for other specified surgical aftercare: Secondary | ICD-10-CM

## 2023-09-09 DIAGNOSIS — R188 Other ascites: Secondary | ICD-10-CM | POA: Diagnosis not present

## 2023-09-09 MED ORDER — SULFAMETHOXAZOLE-TRIMETHOPRIM 800-160 MG PO TABS: 1.0000 | ORAL_TABLET | Freq: Two times a day (BID) | ORAL | 0 refills | Status: AC

## 2023-09-09 MED ORDER — IOHEXOL 350 MG/ML SOLN
100.0000 mL | Freq: Once | INTRAVENOUS | Status: AC | PRN
  Administered 2023-09-09: 100 mL via INTRAVENOUS

## 2023-09-09 NOTE — MAU Note (Signed)
Patient transport at bedside to take patient to radiology.  °

## 2023-09-19 ENCOUNTER — Telehealth (HOSPITAL_COMMUNITY): Payer: Self-pay

## 2023-09-19 NOTE — Telephone Encounter (Signed)
 09/19/2023 1917  Name: Ana Lloyd MRN: [id number] DOB: 01-06-2001  Reason for Call:  Transition of Care Hospital Discharge Call  Contact Status: Patient Contact Status: Complete  Language assistant needed:          Follow-Up Questions: Do You Have Any Concerns About Your Health As You Heal From Delivery?: No Do You Have Any Concerns About Your Infants Health?: No  Edinburgh Postnatal Depression Scale:  In the Past 7 Days: I have been able to laugh and see the funny side of things.: As much as I always could I have looked forward with enjoyment to things.: As much as I ever did I have blamed myself unnecessarily when things went wrong.: No, never I have been anxious or worried for no good reason.: No, not at all I have felt scared or panicky for no good reason.: No, not at all Things have been getting on top of me.: No, I have been coping as well as ever I have been so unhappy that I have had difficulty sleeping.: Not at all I have felt sad or miserable.: No, not at all I have been so unhappy that I have been crying.: No, never The thought of harming myself has occurred to me.: Never Van Postnatal Depression Scale Total: 0  PHQ2-9 Depression Scale:     Discharge Follow-up: Edinburgh score requires follow up?: No Patient was advised of the following resources:: Breastfeeding Support Group, Support Group  Post-discharge interventions: Reviewed Newborn Safe Sleep Practices  Signature  Rosaline Deretha PEAK

## 2023-09-21 DIAGNOSIS — Z01419 Encounter for gynecological examination (general) (routine) without abnormal findings: Secondary | ICD-10-CM | POA: Diagnosis not present

## 2024-02-12 ENCOUNTER — Encounter: Payer: Self-pay | Admitting: Emergency Medicine

## 2024-02-12 ENCOUNTER — Emergency Department
Admission: EM | Admit: 2024-02-12 | Discharge: 2024-02-12 | Disposition: A | Discharge status: Home / Self Care | Attending: Emergency Medicine | Admitting: Emergency Medicine

## 2024-02-12 ENCOUNTER — Other Ambulatory Visit: Payer: Self-pay

## 2024-02-12 DIAGNOSIS — W19XXXA Unspecified fall, initial encounter: Secondary | ICD-10-CM | POA: Diagnosis not present

## 2024-02-12 DIAGNOSIS — S300XXA Contusion of lower back and pelvis, initial encounter: Secondary | ICD-10-CM | POA: Insufficient documentation

## 2024-02-12 DIAGNOSIS — S3992XA Unspecified injury of lower back, initial encounter: Secondary | ICD-10-CM | POA: Diagnosis present

## 2024-02-12 DIAGNOSIS — Y990 Civilian activity done for income or pay: Secondary | ICD-10-CM | POA: Insufficient documentation

## 2024-02-12 MED ORDER — LIDOCAINE 5 % EX PTCH
1.0000 | MEDICATED_PATCH | CUTANEOUS | Status: DC
  Administered 2024-02-12: 1 via TRANSDERMAL
  Filled 2024-02-12: qty 1

## 2024-02-12 NOTE — ED Triage Notes (Signed)
 Pt in via POV, reports falling at work 3 days ago, reports she slipped on floor, complaints of ongoing lower back pain since then.  Ambulatory to triage, NAD noted at this time.

## 2024-02-12 NOTE — ED Provider Notes (Signed)
 "  Ana Lloyd    Event Date/Time   First MD Initiated Contact with Patient 02/12/24 1722     (approximate)   History   Fall   HPI  Ana Lloyd is a 24 y.o. female who presents today for evaluation of low back pain.  Patient reports that 3 days ago she had a slip on wet ground and landed on her buttocks.  She reports that she has had pain in her low back ever since.  She denies numbness or tingling in her lower extremities.  She denies any paresthesias or weakness.  She denies any abdominal pain.  There was no head strike or LOC at the time.  She has not taken anything for her symptoms.  She is not anticoagulated.  Patient Active Problem List   Diagnosis Date Noted   Postpartum care following cesarean delivery 09/04/2023   Other specified postprocedural states 09/02/2023   History of cesarean section 09/02/2023   S/P repeat low transverse C-section 09/02/2023          Physical Exam   Triage Vital Signs: ED Triage Vitals  Encounter Vitals Group     BP 02/12/24 1557 122/85     Girls Systolic BP Percentile --      Girls Diastolic BP Percentile --      Boys Systolic BP Percentile --      Boys Diastolic BP Percentile --      Pulse Rate 02/12/24 1557 84     Resp 02/12/24 1557 15     Temp 02/12/24 1557 97.9 F (36.6 C)     Temp Source 02/12/24 1557 Oral     SpO2 02/12/24 1557 98 %     Weight 02/12/24 1558 180 lb (81.6 kg)     Height 02/12/24 1558 5' 4 (1.626 m)     Head Circumference --      Peak Flow --      Pain Score 02/12/24 1557 8     Pain Loc --      Pain Education --      Exclude from Growth Chart --     Most recent vital signs: Vitals:   02/12/24 1557  BP: 122/85  Pulse: 84  Resp: 15  Temp: 97.9 F (36.6 C)  SpO2: 98%    Physical Exam Vitals and nursing Lloyd reviewed.  Constitutional:      General: Awake and alert. No acute distress.    Appearance: Normal appearance. The patient is normal weight.   HENT:     Head: Normocephalic and atraumatic.     Mouth: Mucous membranes are moist.  Eyes:     General: PERRL. Normal EOMs        Right eye: No discharge.        Left eye: No discharge.     Conjunctiva/sclera: Conjunctivae normal.  Cardiovascular:     Rate and Rhythm: Normal rate and regular rhythm.     Pulses: Normal pulses.  Pulmonary:     Effort: Pulmonary effort is normal. No respiratory distress.     Breath sounds: Normal breath sounds.  Abdominal:     Abdomen is soft. There is no abdominal tenderness. No rebound or guarding. No distention. Musculoskeletal:        General: No swelling. Normal range of motion.     Cervical back: Normal range of motion and neck supple.  Back: No midline tenderness.  Tenderness to palpation across low back.  Strength and sensation 5/5 to  bilateral lower extremities. Normal great toe extension against resistance. Normal sensation throughout feet. Normal patellar reflexes. Negative SLR and opposite SLR bilaterally. Negative FABER test Skin:    General: Skin is warm and dry.     Capillary Refill: Capillary refill takes less than 2 seconds.     Findings: No rash.  Neurological:     Mental Status: The patient is awake and alert.      ED Results / Procedures / Treatments   Labs (all labs ordered are listed, but only abnormal results are displayed) Labs Reviewed - No data to display   EKG     RADIOLOGY     PROCEDURES:  Critical Care performed:   Procedures   MEDICATIONS ORDERED IN ED: Medications  lidocaine  (LIDODERM ) 5 % 1 patch (1 patch Transdermal Patch Applied 02/12/24 1749)     IMPRESSION / MDM / ASSESSMENT AND PLAN / ED COURSE  I reviewed the triage vital signs and the nursing notes.   Differential diagnosis includes, but is not limited to, contusion, coxalgia, less likely vertebral fracture.  Patient is awake and alert, hemodynamically stable and afebrile.  She has 5 out of 5 strength with intact sensation to  extensor hallucis dorsiflexion and plantarflexion of bilateral lower extremities with normal patellar reflexes bilaterally. Most likely etiology at this point is contusion. No red flags to indicate patient is at risk for more auspicious process that would require urgent/emergent spinal imaging or subspecialty evaluation at this time. No major trauma, no midline tenderness, no history or physical exam findings to suggest cauda equina syndrome or spinal cord compression. No focal neurological deficits on exam. No constitutional symptoms or history of immunosuppression or IVDA to suggest potential for epidural abscess. Not anticoagulated, no history of bleeding diastasis to suggest risk for epidural hematoma. No chronic steroid use or advanced age or history of malignancy to suggest proclivity towards pathological fracture.  No abdominal pain or flank pain.  There was no head strike or LOC.  She is ambulatory with a steady gait.  Symptoms most consistent with contusion.  Discussed option of x-ray, though patient does not feel this is necessary and I am in agreement.  She was treated symptomatically with a Lidoderm  patch with good effect.  We discussed symptomatic management at home and return precautions.  She requested a prescription for Lidoderm  patches to use at home which was sent to her pharmacy.  She was given a work Lloyd per her request.  Patient was discharged in stable condition  Patient's presentation is most consistent with acute, uncomplicated illness.    FINAL CLINICAL IMPRESSION(S) / ED DIAGNOSES   Final diagnoses:  Fall, initial encounter  Contusion of lower back, initial encounter     Rx / DC Orders   ED Discharge Orders     None        Lloyd:  This document was prepared using Dragon voice recognition software and may include unintentional dictation errors.   Ana Lien E, PA-C 02/12/24 Ana Lloyd Ana Dunnings, MD 02/12/24 2327  "

## 2024-02-12 NOTE — Discharge Instructions (Signed)
 You may use the Lidoderm  patches to help with your symptoms.  You may also sit on a donut pillow.  Please follow-up with your outpatient provider.  Please return for any new, worsening, or changing symptoms or other concerns.  Was a pleasure caring for you today.

## 2024-02-14 ENCOUNTER — Emergency Department (HOSPITAL_COMMUNITY)
Admission: EM | Admit: 2024-02-14 | Discharge: 2024-02-14 | Discharge status: Against Medical Advice | Attending: Emergency Medicine | Admitting: Emergency Medicine

## 2024-02-14 DIAGNOSIS — W19XXXA Unspecified fall, initial encounter: Secondary | ICD-10-CM | POA: Insufficient documentation

## 2024-02-14 DIAGNOSIS — Z5321 Procedure and treatment not carried out due to patient leaving prior to being seen by health care provider: Secondary | ICD-10-CM | POA: Diagnosis not present

## 2024-02-14 DIAGNOSIS — Z043 Encounter for examination and observation following other accident: Secondary | ICD-10-CM | POA: Diagnosis present
